# Patient Record
Sex: Female | Born: 1970 | Race: Black or African American | Hispanic: No | Marital: Married | Smoking: Never smoker
Health system: Southern US, Community
[De-identification: ages and names within clinical notes are randomized; demographics above are authoritative.]

## PROBLEM LIST (undated history)

## (undated) DIAGNOSIS — F329 Major depressive disorder, single episode, unspecified: Secondary | ICD-10-CM

## (undated) DIAGNOSIS — F32A Depression, unspecified: Secondary | ICD-10-CM

## (undated) DIAGNOSIS — E785 Hyperlipidemia, unspecified: Secondary | ICD-10-CM

## (undated) DIAGNOSIS — H409 Unspecified glaucoma: Secondary | ICD-10-CM

## (undated) DIAGNOSIS — D649 Anemia, unspecified: Secondary | ICD-10-CM

## (undated) DIAGNOSIS — K219 Gastro-esophageal reflux disease without esophagitis: Secondary | ICD-10-CM

## (undated) DIAGNOSIS — J302 Other seasonal allergic rhinitis: Secondary | ICD-10-CM

## (undated) DIAGNOSIS — G43909 Migraine, unspecified, not intractable, without status migrainosus: Secondary | ICD-10-CM

## (undated) DIAGNOSIS — I10 Essential (primary) hypertension: Secondary | ICD-10-CM

## (undated) HISTORY — DX: Migraine, unspecified, not intractable, without status migrainosus: G43.909

## (undated) HISTORY — DX: Essential (primary) hypertension: I10

## (undated) HISTORY — DX: Hyperlipidemia, unspecified: E78.5

## (undated) HISTORY — DX: Major depressive disorder, single episode, unspecified: F32.9

## (undated) HISTORY — PX: TUBAL LIGATION: SHX77

## (undated) HISTORY — DX: Anemia, unspecified: D64.9

## (undated) HISTORY — DX: Gastro-esophageal reflux disease without esophagitis: K21.9

## (undated) HISTORY — DX: Depression, unspecified: F32.A

## (undated) HISTORY — DX: Unspecified glaucoma: H40.9

---

## 1999-03-29 ENCOUNTER — Encounter: Payer: Self-pay | Admitting: Cardiology

## 1999-03-29 ENCOUNTER — Emergency Department (HOSPITAL_COMMUNITY): Admission: EM | Admit: 1999-03-29 | Discharge: 1999-03-29 | Payer: Self-pay | Admitting: Emergency Medicine

## 1999-03-30 ENCOUNTER — Encounter: Payer: Self-pay | Admitting: Cardiovascular Disease

## 1999-03-30 ENCOUNTER — Emergency Department (HOSPITAL_COMMUNITY): Admission: EM | Admit: 1999-03-30 | Discharge: 1999-03-30 | Payer: Self-pay | Admitting: Emergency Medicine

## 2001-02-12 ENCOUNTER — Emergency Department (HOSPITAL_COMMUNITY): Admission: EM | Admit: 2001-02-12 | Discharge: 2001-02-12 | Payer: Self-pay | Admitting: Emergency Medicine

## 2001-07-05 ENCOUNTER — Encounter: Payer: Self-pay | Admitting: Gastroenterology

## 2001-07-05 ENCOUNTER — Encounter: Admission: RE | Admit: 2001-07-05 | Discharge: 2001-07-05 | Payer: Self-pay | Admitting: Gastroenterology

## 2001-08-31 ENCOUNTER — Emergency Department (HOSPITAL_COMMUNITY): Admission: EM | Admit: 2001-08-31 | Discharge: 2001-08-31 | Payer: Self-pay | Admitting: Emergency Medicine

## 2001-08-31 ENCOUNTER — Encounter: Payer: Self-pay | Admitting: Emergency Medicine

## 2001-09-08 ENCOUNTER — Encounter: Admission: RE | Admit: 2001-09-08 | Discharge: 2001-09-08 | Payer: Self-pay | Admitting: Gastroenterology

## 2001-09-08 ENCOUNTER — Encounter: Payer: Self-pay | Admitting: Gastroenterology

## 2001-09-09 ENCOUNTER — Encounter: Payer: Self-pay | Admitting: Emergency Medicine

## 2001-09-09 ENCOUNTER — Emergency Department (HOSPITAL_COMMUNITY): Admission: EM | Admit: 2001-09-09 | Discharge: 2001-09-09 | Payer: Self-pay | Admitting: Emergency Medicine

## 2003-09-01 ENCOUNTER — Emergency Department (HOSPITAL_COMMUNITY): Admission: AD | Admit: 2003-09-01 | Discharge: 2003-09-01 | Payer: Self-pay | Admitting: Family Medicine

## 2004-03-20 ENCOUNTER — Emergency Department (HOSPITAL_COMMUNITY): Admission: EM | Admit: 2004-03-20 | Discharge: 2004-03-20 | Payer: Self-pay | Admitting: Family Medicine

## 2004-04-07 ENCOUNTER — Encounter: Admission: RE | Admit: 2004-04-07 | Discharge: 2004-04-07 | Payer: Self-pay | Admitting: Cardiology

## 2004-06-12 ENCOUNTER — Emergency Department (HOSPITAL_COMMUNITY): Admission: EM | Admit: 2004-06-12 | Discharge: 2004-06-12 | Payer: Self-pay | Admitting: Family Medicine

## 2004-11-10 ENCOUNTER — Emergency Department (HOSPITAL_COMMUNITY): Admission: EM | Admit: 2004-11-10 | Discharge: 2004-11-10 | Payer: Self-pay | Admitting: Family Medicine

## 2005-04-20 ENCOUNTER — Other Ambulatory Visit: Admission: RE | Admit: 2005-04-20 | Discharge: 2005-04-20 | Payer: Self-pay | Admitting: Obstetrics and Gynecology

## 2005-05-31 ENCOUNTER — Encounter: Admission: RE | Admit: 2005-05-31 | Discharge: 2005-05-31 | Payer: Self-pay | Admitting: Cardiology

## 2005-06-20 ENCOUNTER — Emergency Department (HOSPITAL_COMMUNITY): Admission: EM | Admit: 2005-06-20 | Discharge: 2005-06-20 | Payer: Self-pay | Admitting: Emergency Medicine

## 2006-03-10 ENCOUNTER — Emergency Department (HOSPITAL_COMMUNITY): Admission: EM | Admit: 2006-03-10 | Discharge: 2006-03-10 | Payer: Self-pay | Admitting: Family Medicine

## 2006-04-09 ENCOUNTER — Emergency Department (HOSPITAL_COMMUNITY): Admission: EM | Admit: 2006-04-09 | Discharge: 2006-04-09 | Payer: Self-pay | Admitting: Family Medicine

## 2006-06-18 ENCOUNTER — Emergency Department (HOSPITAL_COMMUNITY): Admission: EM | Admit: 2006-06-18 | Discharge: 2006-06-18 | Payer: Self-pay | Admitting: Family Medicine

## 2006-07-10 ENCOUNTER — Emergency Department (HOSPITAL_COMMUNITY): Admission: EM | Admit: 2006-07-10 | Discharge: 2006-07-10 | Payer: Self-pay | Admitting: Emergency Medicine

## 2006-07-28 ENCOUNTER — Ambulatory Visit: Payer: Self-pay | Admitting: Family Medicine

## 2006-08-09 ENCOUNTER — Ambulatory Visit: Payer: Self-pay | Admitting: Family Medicine

## 2006-09-06 ENCOUNTER — Ambulatory Visit: Payer: Self-pay | Admitting: Family Medicine

## 2006-11-02 ENCOUNTER — Emergency Department (HOSPITAL_COMMUNITY): Admission: EM | Admit: 2006-11-02 | Discharge: 2006-11-02 | Payer: Self-pay | Admitting: Emergency Medicine

## 2007-05-27 ENCOUNTER — Ambulatory Visit: Payer: Self-pay | Admitting: Family Medicine

## 2007-07-21 ENCOUNTER — Ambulatory Visit: Payer: Self-pay | Admitting: Family Medicine

## 2007-07-21 DIAGNOSIS — K219 Gastro-esophageal reflux disease without esophagitis: Secondary | ICD-10-CM

## 2007-07-21 DIAGNOSIS — E785 Hyperlipidemia, unspecified: Secondary | ICD-10-CM | POA: Insufficient documentation

## 2007-07-25 ENCOUNTER — Telehealth: Payer: Self-pay | Admitting: Family Medicine

## 2007-08-24 ENCOUNTER — Emergency Department (HOSPITAL_COMMUNITY): Admission: EM | Admit: 2007-08-24 | Discharge: 2007-08-24 | Payer: Self-pay | Admitting: Emergency Medicine

## 2007-12-20 ENCOUNTER — Ambulatory Visit: Payer: Self-pay | Admitting: Family Medicine

## 2007-12-20 DIAGNOSIS — J309 Allergic rhinitis, unspecified: Secondary | ICD-10-CM

## 2008-02-01 ENCOUNTER — Ambulatory Visit: Payer: Self-pay | Admitting: Family Medicine
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
## 2008-06-18 ENCOUNTER — Emergency Department (HOSPITAL_COMMUNITY): Admission: EM | Admit: 2008-06-18 | Discharge: 2008-06-18 | Payer: Self-pay | Admitting: Family Medicine

## 2008-09-18 ENCOUNTER — Telehealth: Payer: Self-pay | Admitting: Family Medicine

## 2008-10-02 ENCOUNTER — Ambulatory Visit: Payer: Self-pay | Admitting: Family Medicine

## 2009-08-02 ENCOUNTER — Ambulatory Visit: Payer: Self-pay | Admitting: Family Medicine

## 2009-10-23 ENCOUNTER — Telehealth: Payer: Self-pay | Admitting: Family Medicine

## 2009-11-01 ENCOUNTER — Ambulatory Visit: Payer: Self-pay | Admitting: Family Medicine

## 2009-11-05 ENCOUNTER — Telehealth: Payer: Self-pay | Admitting: Family Medicine

## 2009-11-11 ENCOUNTER — Ambulatory Visit: Payer: Self-pay | Admitting: Family Medicine

## 2009-11-11 LAB — CONVERTED CEMR LAB
Bilirubin Urine: NEGATIVE
Blood in Urine, dipstick: NEGATIVE
Glucose, Urine, Semiquant: NEGATIVE
Ketones, urine, test strip: NEGATIVE
Nitrite: NEGATIVE
Specific Gravity, Urine: 1.02
Urobilinogen, UA: 0.2
WBC Urine, dipstick: NEGATIVE
pH: 7

## 2009-11-12 ENCOUNTER — Telehealth: Payer: Self-pay | Admitting: Family Medicine

## 2009-11-12 LAB — CONVERTED CEMR LAB
ALT: 17 units/L (ref 0–35)
AST: 21 units/L (ref 0–37)
Albumin: 3.8 g/dL (ref 3.5–5.2)
Alkaline Phosphatase: 69 units/L (ref 39–117)
BUN: 14 mg/dL (ref 6–23)
Basophils Absolute: 0 10*3/uL (ref 0.0–0.1)
Basophils Relative: 0.4 % (ref 0.0–3.0)
Bilirubin, Direct: 0 mg/dL (ref 0.0–0.3)
CO2: 29 meq/L (ref 19–32)
Calcium: 9.3 mg/dL (ref 8.4–10.5)
Chloride: 106 meq/L (ref 96–112)
Cholesterol: 238 mg/dL — ABNORMAL HIGH (ref 0–200)
Creatinine, Ser: 0.8 mg/dL (ref 0.4–1.2)
Direct LDL: 150.9 mg/dL
Eosinophils Absolute: 0.2 10*3/uL (ref 0.0–0.7)
Eosinophils Relative: 2.8 % (ref 0.0–5.0)
GFR calc non Af Amer: 104.64 mL/min (ref >60)
Glucose, Bld: 93 mg/dL (ref 70–99)
HCT: 31.4 % — ABNORMAL LOW (ref 36.0–46.0)
HDL: 47.1 mg/dL (ref >39.00)
Hemoglobin: 10.7 g/dL — ABNORMAL LOW (ref 12.0–15.0)
Lymphocytes Relative: 44.1 % (ref 12.0–46.0)
Lymphs Abs: 2.4 10*3/uL (ref 0.7–4.0)
MCHC: 33.9 g/dL (ref 30.0–36.0)
MCV: 86.9 fL (ref 78.0–100.0)
Monocytes Absolute: 0.4 10*3/uL (ref 0.1–1.0)
Monocytes Relative: 7.3 % (ref 3.0–12.0)
Neutro Abs: 2.5 10*3/uL (ref 1.4–7.7)
Neutrophils Relative %: 45.4 % (ref 43.0–77.0)
Platelets: 228 10*3/uL (ref 150.0–400.0)
Potassium: 4.9 meq/L (ref 3.5–5.1)
RBC: 3.62 M/uL — ABNORMAL LOW (ref 3.87–5.11)
RDW: 13.3 % (ref 11.5–14.6)
Sodium: 142 meq/L (ref 135–145)
TSH: 1.08 microintl units/mL (ref 0.35–5.50)
Total Bilirubin: 0.3 mg/dL (ref 0.3–1.2)
Total CHOL/HDL Ratio: 5
Total Protein: 7.2 g/dL (ref 6.0–8.3)
Triglycerides: 221 mg/dL — ABNORMAL HIGH (ref 0.0–149.0)
VLDL: 44.2 mg/dL — ABNORMAL HIGH (ref 0.0–40.0)
WBC: 5.5 10*3/uL (ref 4.5–10.5)

## 2009-11-18 ENCOUNTER — Ambulatory Visit: Payer: Self-pay | Admitting: Family Medicine

## 2009-12-07 ENCOUNTER — Emergency Department (HOSPITAL_COMMUNITY): Admission: EM | Admit: 2009-12-07 | Discharge: 2009-12-07 | Payer: Self-pay | Admitting: Emergency Medicine

## 2009-12-09 ENCOUNTER — Telehealth: Payer: Self-pay | Admitting: Family Medicine

## 2009-12-20 ENCOUNTER — Ambulatory Visit: Payer: Self-pay | Admitting: Family Medicine

## 2009-12-31 ENCOUNTER — Ambulatory Visit: Payer: Self-pay | Admitting: Family Medicine

## 2010-01-10 ENCOUNTER — Emergency Department (HOSPITAL_COMMUNITY): Admission: EM | Admit: 2010-01-10 | Discharge: 2010-01-10 | Payer: Self-pay | Admitting: Family Medicine

## 2010-01-16 ENCOUNTER — Telehealth: Payer: Self-pay | Admitting: Family Medicine

## 2010-01-31 ENCOUNTER — Emergency Department (HOSPITAL_COMMUNITY): Admission: EM | Admit: 2010-01-31 | Discharge: 2010-01-31 | Payer: Self-pay | Admitting: Family Medicine

## 2010-05-05 ENCOUNTER — Encounter: Payer: Self-pay | Admitting: Family Medicine

## 2010-06-09 ENCOUNTER — Encounter: Payer: Self-pay | Admitting: Family Medicine

## 2010-07-06 DIAGNOSIS — E079 Disorder of thyroid, unspecified: Secondary | ICD-10-CM

## 2010-07-06 HISTORY — DX: Disorder of thyroid, unspecified: E07.9

## 2010-07-10 ENCOUNTER — Ambulatory Visit
Admission: RE | Admit: 2010-07-10 | Discharge: 2010-07-10 | Payer: Self-pay | Source: Home / Self Care | Attending: Family Medicine | Admitting: Family Medicine

## 2010-07-10 DIAGNOSIS — D649 Anemia, unspecified: Secondary | ICD-10-CM | POA: Insufficient documentation

## 2010-07-11 ENCOUNTER — Other Ambulatory Visit: Payer: Self-pay | Admitting: Family Medicine

## 2010-07-11 ENCOUNTER — Ambulatory Visit
Admission: RE | Admit: 2010-07-11 | Discharge: 2010-07-11 | Payer: Self-pay | Source: Home / Self Care | Attending: Family Medicine | Admitting: Family Medicine

## 2010-07-11 LAB — CBC WITH DIFFERENTIAL/PLATELET
Basophils Absolute: 0 10*3/uL (ref 0.0–0.1)
Basophils Relative: 0.6 % (ref 0.0–3.0)
Eosinophils Absolute: 0.1 10*3/uL (ref 0.0–0.7)
Eosinophils Relative: 2.3 % (ref 0.0–5.0)
HCT: 33.1 % — ABNORMAL LOW (ref 36.0–46.0)
Hemoglobin: 11.2 g/dL — ABNORMAL LOW (ref 12.0–15.0)
Lymphocytes Relative: 43.7 % (ref 12.0–46.0)
Lymphs Abs: 2.2 10*3/uL (ref 0.7–4.0)
MCHC: 33.9 g/dL (ref 30.0–36.0)
MCV: 86.8 fl (ref 78.0–100.0)
Monocytes Absolute: 0.3 10*3/uL (ref 0.1–1.0)
Monocytes Relative: 6.8 % (ref 3.0–12.0)
Neutro Abs: 2.3 10*3/uL (ref 1.4–7.7)
Neutrophils Relative %: 46.6 % (ref 43.0–77.0)
Platelets: 226 10*3/uL (ref 150.0–400.0)
RBC: 3.82 Mil/uL — ABNORMAL LOW (ref 3.87–5.11)
RDW: 12.9 % (ref 11.5–14.6)
WBC: 4.9 10*3/uL (ref 4.5–10.5)

## 2010-07-11 LAB — TSH: TSH: 0.91 u[IU]/mL (ref 0.35–5.50)

## 2010-07-11 LAB — LIPID PANEL
Cholesterol: 217 mg/dL — ABNORMAL HIGH (ref 0–200)
HDL: 44.5 mg/dL (ref >39.00)
Total CHOL/HDL Ratio: 5
Triglycerides: 278 mg/dL — ABNORMAL HIGH (ref 0.0–149.0)
VLDL: 55.6 mg/dL — ABNORMAL HIGH (ref 0.0–40.0)

## 2010-07-11 LAB — HIGH SENSITIVITY CRP: CRP, High Sensitivity: 0.59 mg/L (ref 0.00–5.00)

## 2010-07-11 LAB — SEDIMENTATION RATE: Sed Rate: 38 mm/hr — ABNORMAL HIGH (ref 0–22)

## 2010-07-11 LAB — LDL CHOLESTEROL, DIRECT: Direct LDL: 118.4 mg/dL

## 2010-08-05 NOTE — Progress Notes (Signed)
Summary: no show  Phone Note Other Incoming   Summary of Call: no show  Follow-up for Phone Call        charge the NS fee Follow-up by: Nelwyn Salisbury MD,  January 16, 2010 12:33 PM

## 2010-08-05 NOTE — Progress Notes (Signed)
Summary: Pt is req FMLA statement re: Dr Clent Ridges treating pt for migraines  Phone Note Call from Patient Call back at Home Phone [phone]   Caller: Patient Summary of Call: Pt called and is needing to get a FMLA from where Dr. Clent Ridges took pt out of work for Migraines. Pt req a statement that says that Dr. Clent Ridges has been treating pt for migraines and how often pt has been out of work for them. Pt is needing this done asap.  Initial call taken by: Lucy Antigua,  December 09, 2009 3:53 PM  Follow-up for Phone Call        she needs to supply me with an FLMA form so I can fill it out Follow-up by: Nelwyn Salisbury MD,  December 18, 2009 8:48 AM  Additional Follow-up for Phone Call Additional follow up Details #1::        [facility] Additional Follow-up by: Raechel Ache, RN,  December 18, 2009 9:29 AM

## 2010-08-05 NOTE — Progress Notes (Signed)
Summary: Pt req addtl time added to [facility] or doctors note  Phone Note Call from Patient Call back at [facility] Phone [phone]   Caller: Patient Summary of Call: Pt called and said that Dr. Clent Ridges gave her a FMLA Form, so that pt could be out of work for 3 days and pt has gone over that time by 1 day. Pt is req a doctors note to return to work tonight at 10pm, if not, pt will need to be out addtl 2 days. Please advise.  Initial call taken by: Lucy Antigua,  October 23, 2009 4:05 PM  Follow-up for Phone Call        please give her a note to cover the additional days Follow-up by: Nelwyn Salisbury MD,  October 28, 2009 9:29 AM  Additional Follow-up for Phone Call Additional follow up Details #1::        [facility] Additional Follow-up by: Raechel Ache, RN,  October 28, 2009 10:47 AM

## 2010-08-05 NOTE — Progress Notes (Signed)
Summary: rtc  Phone Note Call from Patient Call back at Home Phone [phone]   Caller: Patient Call For: Nelwyn Salisbury MD Summary of Call: pt is return judy call Initial call taken by: Heron Sabins,  Nov 12, 2009 12:11 PM  Follow-up for Phone Call        Phone Call Completed Follow-up by: Raechel Ache, RN,  Nov 12, 2009 12:38 PM

## 2010-08-05 NOTE — Progress Notes (Signed)
Summary: problems with Cymbalta  Phone Note Call from Patient   Caller: Patient Call For: Nelwyn Salisbury MD Summary of Call: Pt had meds changed from Wellbutrin to Cymbalta, and is feeling very tired and sleepy.  Cannot stay at work. [phone]  Initial call taken by: Lynann Beaver CMA,  Nov 05, 2009 2:44 PM  Follow-up for Phone Call        okay it may be too strong. Stop this and call in Zoloft 50 mg once daily , #30 with no rf. See me in 2 weeks.  Follow-up by: Nelwyn Salisbury MD,  Nov 06, 2009 1:20 PM  Additional Follow-up for Phone Call Additional follow up Details #1::        Phone Call Completed, Rx Called In Additional Follow-up by: Raechel Ache, RN,  Nov 06, 2009 1:44 PM    New/Updated Medications: ZOLOFT 50 MG TABS (SERTRALINE HCL) 1 once daily Prescriptions: ZOLOFT 50 MG TABS (SERTRALINE HCL) 1 once daily  #30 x 0   Entered by:   Raechel Ache, RN   Authorized by:   Nelwyn Salisbury MD   Signed by:   Raechel Ache, RN on 11/06/2009   Method used:   Electronically to        [facility]  [address] [phone]* (retail)       [address]       [place], [place]  [address]       Ph: [phone]       Fax: [phone]   RxID:   [id number]

## 2010-08-05 NOTE — Assessment & Plan Note (Signed)
Summary: migraines//ccm   Vital Signs:  Patient profile:   40 year old female Weight:      149 pounds BP sitting:   164 / 102  (left arm) Cuff size:   regular  Vitals Entered By: Raechel Ache, RN (December 20, 2009 3:08 PM) CC: C/o bad headaches and sick to stomach.   History of Present Illness: Here for worsening HAs over the past 2 weeks. She has been taking Cymbalta for about a month, but she has been feeling poorly since she started it. She feels depressed, tired, moody, and now the HAs are worse as well. She has had a global HA all day every day for 2 weeks. Imitrex does not help. She is nauseated but has not vomited. No blurred vision but she is sensitive to bright lights. No other neurologic deficits. Her daughter drove her here today.   Allergies: 1)  Celexa 2)  Penicillin G Potassium (Penicillin G Potassium)  Past History:  Past Medical History: Reviewed history from 11/18/2009 and no changes required. migraine headaches Depression GERD Hyperlipidemia sees Dr. Vickey Sages for GYN exams  Review of Systems  The patient denies anorexia, fever, weight loss, weight gain, vision loss, decreased hearing, hoarseness, chest pain, syncope, dyspnea on exertion, peripheral edema, prolonged cough, hemoptysis, abdominal pain, melena, hematochezia, severe indigestion/heartburn, hematuria, incontinence, genital sores, muscle weakness, suspicious skin lesions, transient blindness, difficulty walking, unusual weight change, abnormal bleeding, enlarged lymph nodes, angioedema, breast masses, and testicular masses.    Physical Exam  General:  in pain, photophobic, alert Head:  Normocephalic and atraumatic without obvious abnormalities. No apparent alopecia or balding. Eyes:  No corneal or conjunctival inflammation noted. EOMI. Perrla. Funduscopic exam benign, without hemorrhages, exudates or papilledema. Vision grossly normal. Neck:  No deformities, masses, or tenderness noted. Lungs:  Normal  respiratory effort, chest expands symmetrically. Lungs are clear to auscultation, no crackles or wheezes. Heart:  Normal rate and regular rhythm. S1 and S2 normal without gallop, murmur, click, rub or other extra sounds. Neurologic:  No cranial nerve deficits noted. Station and gait are normal. Plantar reflexes are down-going bilaterally. DTRs are symmetrical throughout. Sensory, motor and coordinative functions appear intact.   Impression & Recommendations:  Problem # 1:  MIGRAINE HEADACHE (ICD-346.90)  Her updated medication list for this problem includes:    Imitrex 100 Mg Tabs (Sumatriptan succinate) ..... One as needed ha    Vicodin 5-500 Mg Tabs (Hydrocodone-acetaminophen) .Marland Kitchen... 1 q 6 hours as needed ha  Orders: Headache Clinic Referral (Headache) Demerol  100mg   Injection (B1478) Admin of Therapeutic Inj  intramuscular or subcutaneous (29562) Promethazine up to 50mg  (J2550)  Problem # 2:  DEPRESSION (ICD-311)  The following medications were removed from the medication list:    Cymbalta 30 Mg Cpep (Duloxetine hcl) ..... Once daily  Complete Medication List: 1)  Imitrex 100 Mg Tabs (Sumatriptan succinate) .... One as needed ha 2)  Protonix 40 Mg Tbec (Pantoprazole sodium) .... Once daily 3)  Claritin-d 24 Hour 10-240 Mg Xr24h-tab (Loratadine-pseudoephedrine) .... Once daily 4)  Vicodin 5-500 Mg Tabs (Hydrocodone-acetaminophen) .Marland Kitchen.. 1 q 6 hours as needed ha  Patient Instructions: 1)  Stop Cymbalta. Conitnue Imitrex as needed . Use Vicodin as needed . will refer to Neurology Prescriptions: VICODIN 5-500 MG TABS (HYDROCODONE-ACETAMINOPHEN) 1 q 6 hours as needed HA  #60 x 0   Entered and Authorized by:   Nelwyn Salisbury MD   Signed by:   Nelwyn Salisbury MD on 12/20/2009   Method  used:   Print then Give to Patient   RxID:   [id number]    Medication Administration  Injection # 1:    Medication: Demerol  100mg   Injection    Diagnosis: MIGRAINE HEADACHE (ICD-346.90)     Route: IM    Site: LUOQ gluteus    Exp Date: 09/04/2010    Lot #: [id number]    Mfr: Hospira    Comments: 50mg  given    Patient tolerated injection without complications    Given by: Raechel Ache, RN (December 20, 2009 4:03 PM)  Injection # 2:    Medication: Promethazine up to 50mg     Diagnosis: MIGRAINE HEADACHE (ICD-346.90)    Route: IM    Site: LUOQ gluteus    Exp Date: 11/12    Lot #: [id number]    Mfr: [email or web address]    Comments: 25mg  given  Orders Added: 1)  Est. Patient Level IV [65784] 2)  Headache Clinic Referral [Headache] 3)  Demerol  100mg   Injection [J2175] 4)  Admin of Therapeutic Inj  intramuscular or subcutaneous [96372] 5)  Promethazine up to 50mg  [J2550]

## 2010-08-05 NOTE — Letter (Signed)
Summary: [facility]   Imported By: Maryln Gottron 05/26/2010 11:18:08  _____________________________________________________________________  External Attachment:    Type:   Image     Comment:   External Document

## 2010-08-05 NOTE — Assessment & Plan Note (Signed)
Summary: headaches//ccm   Vital Signs:  Patient profile:   40 year old female Weight:      146 pounds BMI:     24.01 Temp:     98.6 degrees F oral BP sitting:   128 / 78  (left arm) Cuff size:   regular  Vitals Entered By: Raechel Ache, RN (November 01, 2009 11:43 AM) CC: Talk about Wellbutrin.   History of Present Illness: Here for several reasons. First she thinks the Wellbutrin is not doing much for her depression. She still feels sad, unmotivated, tired, and has trouble enjoying things. She sleeps well, and her appetite is fine. Also 3 days ago she scratched her right upper arm on the corner of a mop, and she thinks her last tetanus shot was many years ago.   Allergies: 1)  Celexa 2)  Penicillin G Potassium (Penicillin G Potassium)  Past History:  Past Medical History: Reviewed history from 08/02/2009 and no changes required. migraine headaches Depression GERD Hyperlipidemia  Review of Systems  The patient denies anorexia, fever, weight loss, weight gain, vision loss, decreased hearing, hoarseness, chest pain, syncope, dyspnea on exertion, peripheral edema, prolonged cough, headaches, hemoptysis, abdominal pain, melena, hematochezia, severe indigestion/heartburn, hematuria, incontinence, genital sores, muscle weakness, suspicious skin lesions, transient blindness, difficulty walking, unusual weight change, abnormal bleeding, enlarged lymph nodes, angioedema, breast masses, and testicular masses.    Physical Exam  General:  Well-developed,well-nourished,in no acute distress; alert,appropriate and cooperative throughout examination Skin:  long superficial scratch on the right upper arm  Psych:  Cognition and judgment appear intact. Alert and cooperative with normal attention span and concentration. No apparent delusions, illusions, hallucinations   Impression & Recommendations:  Problem # 1:  DEPRESSION (ICD-311)  The following medications were removed from the  medication list:    Wellbutrin Xl 150 Mg Xr24h-tab (Bupropion hcl) ..... Once daily Her updated medication list for this problem includes:    Cymbalta 30 Mg Cpep (Duloxetine hcl) ..... Once daily  Problem # 2:  LACERATION (ICD-879.8)  Complete Medication List: 1)  Imitrex 100 Mg Tabs (Sumatriptan succinate) .... One as needed ha 2)  Diclofenac Sodium 50 Mg Tbec (Diclofenac sodium) .... Three times a day as needed pain 3)  Protonix 40 Mg Tbec (Pantoprazole sodium) .... Once daily 4)  Claritin-d 24 Hour 10-240 Mg Xr24h-tab (Loratadine-pseudoephedrine) .... Once daily 5)  Cymbalta 30 Mg Cpep (Duloxetine hcl) .... Once daily  Other Orders: Tdap => 54yrs IM (16109) Admin 1st Vaccine (60454)  Patient Instructions: 1)  Switch from Wellbutrin to Cymbalta. Hopefully boosting her norepinephrine levels will help. Recheck in 3 weeks with a cpx.    Immunizations Administered:  Tetanus Vaccine:    Vaccine Type: Tdap    Site: right deltoid    Mfr: GlaxoSmithKline    Dose: 0.5 ml    Route: IM    Given by: Raechel Ache, RN    Exp. Date: 09/28/2011    Lot #: [id number]    VIS given: 05/24/07 version given November 01, 2009.

## 2010-08-05 NOTE — Assessment & Plan Note (Signed)
Summary: WHELPS, ?ALLERGIC RX/HEADACHES CONTINUE/PS/PT RSC/CJR   Vital Signs:  Patient profile:   40 year old female Weight:      154 pounds Temp:     98.3 degrees F oral BP sitting:   130 / 78  (left arm) Cuff size:   regular  Vitals Entered By: Raechel Ache, RN (December 31, 2009 3:22 PM) CC: c/O "WHELPS"OR HIVES ALL OVER & ITCHY X FEW DAYS.   History of Present Illness: Here for itchy rashes all over her body that have come up over the past week. She continues to have daily HAs, but they are not as severe as when she was here last. She is working with [facility] to get an appointment soon. No new meds recenyl, no other new things in her environment. She thinks the rashes are coming from her "nerves".   Allergies: 1)  Celexa 2)  Penicillin G Potassium (Penicillin G Potassium)  Past History:  Past Medical History: Reviewed history from 11/18/2009 and no changes required. migraine headaches Depression GERD Hyperlipidemia sees Dr. Vickey Sages for GYN exams  Review of Systems  The patient denies anorexia, fever, weight loss, weight gain, vision loss, decreased hearing, hoarseness, chest pain, syncope, dyspnea on exertion, peripheral edema, prolonged cough, hemoptysis, abdominal pain, melena, hematochezia, severe indigestion/heartburn, hematuria, incontinence, genital sores, muscle weakness, suspicious skin lesions, transient blindness, difficulty walking, depression, unusual weight change, abnormal bleeding, enlarged lymph nodes, angioedema, breast masses, and testicular masses.    Physical Exam  General:  Well-developed,well-nourished,in no acute distress; alert,appropriate and cooperative throughout examination Eyes:  No corneal or conjunctival inflammation noted. EOMI. Perrla. Funduscopic exam benign, without hemorrhages, exudates or papilledema. Vision grossly normal. Neurologic:  alert & oriented X3, cranial nerves II-XII intact, and gait normal.   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 One as needed ha    Vicodin 5-500 Mg Tabs (Hydrocodone-acetaminophen) .Marland Kitchen... 1 q 6 hours as needed ha  Problem # 3:  ANXIETY STATE, UNSPECIFIED (ICD-300.00)  Her updated medication list for this problem includes:    Alprazolam 1 Mg Tabs (Alprazolam) .Marland Kitchen..Marland Kitchen Two times a day as needed anxiety  Complete Medication List: 1)  Imitrex 100 Mg Tabs (Sumatriptan succinate) .... One as needed ha 2)  Protonix 40 Mg Tbec (Pantoprazole sodium) .... Once daily 3)  Claritin-d 24 Hour 10-240 Mg Xr24h-tab (Loratadine-pseudoephedrine) .... Once daily 4)  Vicodin 5-500 Mg Tabs (Hydrocodone-acetaminophen) .Marland Kitchen.. 1 q 6 hours as needed ha 5)  Alprazolam 1 Mg Tabs (Alprazolam) .... Two times a day as needed anxiety  Patient Instructions: 1)  Try Xanax as needed for anxiety. See the [facility] ASAP. Try Zyrtec 10 mg two times a day for itching.  Prescriptions: ALPRAZOLAM 1 MG TABS (ALPRAZOLAM) two times a day as needed anxiety  #60 x 2   Entered and Authorized by:   Nelwyn Salisbury MD   Signed by:   Nelwyn Salisbury MD on 12/31/2009   Method used:   Print then Give to Patient   RxID:   [id number]

## 2010-08-05 NOTE — Assessment & Plan Note (Signed)
Summary: VISION DISTURBANCE, H/A // RS   Vital Signs:  Patient profile:   40 year old female Weight:      154 pounds BMI:     25.33 Temp:     98.5 degrees F oral Pulse rate:   69 / minute BP sitting:   134 / 84  (left arm) Cuff size:   regular  Vitals Entered By: Alfred Levins, CMA (August 02, 2009 3:50 PM) CC: she sees red dots when she turns her head x2   History of Present Illness: Here for HAs and for flashing lights in her vision for the past 2 days. She has had migraines for years, but does not usually have auras with them. Now for 2 days has had an intermittent dull HA in the left forehead. Has had brief flashing red lights in both fields of vision. Some nausea but no vomitting. Has had a lot of allergy sympotms as well this winter with sinus congestion, sneezing, and itchy eyes. No fever or ST or cough. She needs med refills. Has been out of Wellbutrin for several months. She averages 3 or 4 migraines a month, each lasting 1-3 days.   Current Medications (verified): 1)  Imitrex 100 Mg Tabs (Sumatriptan Succinate) .... One As Needed Ha 2)  Diclofenac Sodium 50 Mg  Tbec (Diclofenac Sodium) .... Three Times A Day As Needed Pain 3)  Wellbutrin Xl 150 Mg Xr24h-Tab (Bupropion Hcl) .... Once Daily  Allergies (verified): 1)  Celexa 2)  Penicillin G Potassium (Penicillin G Potassium)  Past History:  Past Medical History: migraine headaches Depression GERD Hyperlipidemia  Review of Systems  The patient denies anorexia, fever, weight loss, weight gain, vision loss, decreased hearing, hoarseness, chest pain, syncope, dyspnea on exertion, peripheral edema, prolonged cough, hemoptysis, abdominal pain, melena, hematochezia, severe indigestion/heartburn, hematuria, incontinence, genital sores, muscle weakness, suspicious skin lesions, transient blindness, difficulty walking, depression, unusual weight change, abnormal bleeding, enlarged lymph nodes, angioedema, breast masses, and  testicular masses.    Physical Exam  General:  Well-developed,well-nourished,in no acute distress; alert,appropriate and cooperative throughout examination Head:  Normocephalic and atraumatic without obvious abnormalities. No apparent alopecia or balding. Eyes:  No corneal or conjunctival inflammation noted. EOMI. Perrla. Funduscopic exam benign, without hemorrhages, exudates or papilledema. Vision grossly normal. Ears:  External ear exam shows no significant lesions or deformities.  Otoscopic examination reveals clear canals, tympanic membranes are intact bilaterally without bulging, retraction, inflammation or discharge. Hearing is grossly normal bilaterally. Nose:  External nasal examination shows no deformity or inflammation. Nasal mucosa are pink and moist without lesions or exudates. Mouth:  Oral mucosa and oropharynx without lesions or exudates.  Teeth in good repair. Neck:  No deformities, masses, or tenderness noted. Neurologic:  No cranial nerve deficits noted. Station and gait are normal. Plantar reflexes are down-going bilaterally. DTRs are symmetrical throughout. Sensory, motor and coordinative functions appear intact.   Impression & Recommendations:  Problem # 1:  MIGRAINE HEADACHE (ICD-346.90)  Her updated medication list for this problem includes:    Imitrex 100 Mg Tabs (Sumatriptan succinate) ..... One as needed ha    Diclofenac Sodium 50 Mg Tbec (Diclofenac sodium) .Marland Kitchen... Three times a day as needed pain  Problem # 2:  ALLERGIC RHINITIS (ICD-477.9)  Problem # 3:  GERD (ICD-530.81)  Her updated medication list for this problem includes:    Protonix 40 Mg Tbec (Pantoprazole sodium) ..... Once daily  Problem # 4:  DEPRESSION (ICD-311)  Her updated medication list for this problem includes:  Wellbutrin Xl 150 Mg Xr24h-tab (Bupropion hcl) ..... Once daily  Complete Medication List: 1)  Imitrex 100 Mg Tabs (Sumatriptan succinate) .... One as needed ha 2)  Diclofenac  Sodium 50 Mg Tbec (Diclofenac sodium) .... Three times a day as needed pain 3)  Wellbutrin Xl 150 Mg Xr24h-tab (Bupropion hcl) .... Once daily 4)  Protonix 40 Mg Tbec (Pantoprazole sodium) .... Once daily 5)  Claritin-d 24 Hour 10-240 Mg Xr24h-tab (Loratadine-pseudoephedrine) .... Once daily  Patient Instructions: 1)  These visual changes represent optical manifestations  of her migraines. refilled Imitrex to use as needed . Her allergies are not controlled, andI think these contribute to her migraines. Get on Claritin D or Zyrtec D daily. Try Protonix for the GERD. I filled out FMLA papers for intermittent absences from work due to the migraines.  2)  Please schedule a follow-up appointment as needed .  Prescriptions: PROTONIX 40 MG TBEC (PANTOPRAZOLE SODIUM) once daily  #90 x 3   Entered and Authorized by:   Nelwyn Salisbury MD   Signed by:   Nelwyn Salisbury MD on 08/02/2009   Method used:   Print then Give to Patient   RxID:   [id number] WELLBUTRIN XL 150 MG XR24H-TAB (BUPROPION HCL) once daily  #90 x 3   Entered and Authorized by:   Nelwyn Salisbury MD   Signed by:   Nelwyn Salisbury MD on 08/02/2009   Method used:   Print then Give to Patient   RxID:   [id number] IMITREX 100 MG TABS (SUMATRIPTAN SUCCINATE) one as needed HA  #36 x 3   Entered and Authorized by:   Nelwyn Salisbury MD   Signed by:   Nelwyn Salisbury MD on 08/02/2009   Method used:   Print then Give to Patient   RxID:   [id number]

## 2010-08-05 NOTE — Assessment & Plan Note (Signed)
Summary: CPX//ALP   Vital Signs:  Patient profile:   40 year old female Weight:      146 pounds BMI:     24.01 BP sitting:   110 / 80  (left arm) Cuff size:   regular  Vitals Entered By: Raechel Ache, RN (Nov 18, 2009 8:50 AM) CC: CPX, labs done, sees gyn.   History of Present Illness: 40 yr old female for a cpx. She had tried Cymbalta after our last visit as she was stopping Wellbutrin. She took 7 days of this and felt it was helping her depression, but she stoped it due to sleepiness. She has already tried Zoloft and did not want to try this again. Now she wonders if she should give Cymbalta one more try now that the Wellbutrin is out of her system.   Allergies: 1)  Celexa 2)  Penicillin G Potassium (Penicillin G Potassium)  Past History:  Past Medical History: migraine headaches Depression GERD Hyperlipidemia sees Dr. Vickey Sages for GYN exams  Past Surgical History: Reviewed history from 05/27/2007 and no changes required. Tubal ligation  Family History: Reviewed history from 05/27/2007 and no changes required. Family History Diabetes 1st degree relative Family History High cholesterol Family History of Stroke M 1st degree relative <50  Social History: Reviewed history from 05/27/2007 and no changes required. Divorced Never Smoked Alcohol use-yes Drug use-no  Review of Systems  The patient denies anorexia, fever, weight loss, weight gain, vision loss, decreased hearing, hoarseness, chest pain, syncope, dyspnea on exertion, peripheral edema, prolonged cough, headaches, hemoptysis, abdominal pain, melena, hematochezia, severe indigestion/heartburn, hematuria, incontinence, genital sores, muscle weakness, suspicious skin lesions, transient blindness, difficulty walking, unusual weight change, abnormal bleeding, enlarged lymph nodes, angioedema, breast masses, and testicular masses.    Physical Exam  General:  Well-developed,well-nourished,in no acute distress;  alert,appropriate and cooperative throughout examination Head:  Normocephalic and atraumatic without obvious abnormalities. No apparent alopecia or balding. Eyes:  No corneal or conjunctival inflammation noted. EOMI. Perrla. Funduscopic exam benign, without hemorrhages, exudates or papilledema. Vision grossly normal. Ears:  External ear exam shows no significant lesions or deformities.  Otoscopic examination reveals clear canals, tympanic membranes are intact bilaterally without bulging, retraction, inflammation or discharge. Hearing is grossly normal bilaterally. Nose:  External nasal examination shows no deformity or inflammation. Nasal mucosa are pink and moist without lesions or exudates. Mouth:  Oral mucosa and oropharynx without lesions or exudates.  Teeth in good repair. Neck:  No deformities, masses, or tenderness noted. Chest Wall:  No deformities, masses, or tenderness noted. Lungs:  Normal respiratory effort, chest expands symmetrically. Lungs are clear to auscultation, no crackles or wheezes. Heart:  Normal rate and regular rhythm. S1 and S2 normal without gallop, murmur, click, rub or other extra sounds. Abdomen:  Bowel sounds positive,abdomen soft and non-tender without masses, organomegaly or hernias noted. Msk:  No deformity or scoliosis noted of thoracic or lumbar spine.   Pulses:  R and L carotid,radial,femoral,dorsalis pedis and posterior tibial pulses are full and equal bilaterally Extremities:  No clubbing, cyanosis, edema, or deformity noted with normal full range of motion of all joints.   Neurologic:  No cranial nerve deficits noted. Station and gait are normal. Plantar reflexes are down-going bilaterally. DTRs are symmetrical throughout. Sensory, motor and coordinative functions appear intact. Skin:  Intact without suspicious lesions or rashes Cervical Nodes:  No lymphadenopathy noted Axillary Nodes:  No palpable lymphadenopathy Inguinal Nodes:  No significant  adenopathy Psych:  Cognition and judgment appear intact.  Alert and cooperative with normal attention span and concentration. No apparent delusions, illusions, hallucinations   Impression & Recommendations:  Problem # 1:  HEALTH MAINTENANCE EXAM (ICD-V70.0)  Complete Medication List: 1)  Imitrex 100 Mg Tabs (Sumatriptan succinate) .... One as needed ha 2)  Protonix 40 Mg Tbec (Pantoprazole sodium) .... Once daily 3)  Claritin-d 24 Hour 10-240 Mg Xr24h-tab (Loratadine-pseudoephedrine) .... Once daily 4)  Cymbalta 30 Mg Cpep (Duloxetine hcl) .... Once daily  Patient Instructions: 1)  Try Cymbalta one more time. Call me in 3 weeks

## 2010-08-07 NOTE — Assessment & Plan Note (Signed)
Summary: COUGH AND CONGESTION//SLM   Vital Signs:  Patient profile:   40 year old female Weight:      159 pounds Temp:     97.9 degrees F oral BP sitting:   130 / 98  (left arm) Cuff size:   regular  Vitals Entered By: Sid Falcon LPN (July 10, 2010 5:05 PM)  History of Present Illness: Pt seen as a work in for the following:  Over one month hx of bilateral max facial pain and some nasal congestion. Using Afrin rarely.  NO signif PND and no purulent secretions.  Only rare cough and no sore throat.  Intermittent bifrontal and occipital headaches which seem to correlate with her facial pain.  No fever.  No alleviating factors.  No hx of seasonal  allergies.  Hx glaucoma and had laser eye surgery for this Dec 5 and she felt symtpms started around then.  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   Pt does have hx of hyperlipidemia.  Also hx of anemia and is taking iron.  Some fatigue issues recently.  No orthostatic symptoms.  Good appetite and eating well. Pt concerned about thyroid with some recnet weight gain and FH of hypothroid in mother.  Allergies: 1)  Celexa 2)  Penicillin G Potassium (Penicillin G Potassium)  Past History:  Past Medical History: Last updated: 11/18/2009 migraine headaches Depression GERD Hyperlipidemia sees Dr. Vickey Sages for GYN exams  Past Surgical History: Last updated: 05/27/2007 Tubal ligation  Family History: Last updated: 05/27/2007 Family History Diabetes 1st degree relative Family History High cholesterol Family History of Stroke M 1st degree relative <50  Social History: Last updated: 05/27/2007 Divorced Never Smoked Alcohol use-yes Drug use-no  Risk Factors: Smoking Status: never (05/27/2007) PMH-FH-SH reviewed for relevance  Review of Systems       The patient complains of headaches and weight gain.  The patient denies anorexia, fever, hoarseness,  chest pain, dyspnea on exertion, peripheral edema, prolonged cough, hemoptysis, abdominal pain, melena, hematochezia, and severe indigestion/heartburn.    Physical Exam  General:  Well-developed,well-nourished,in no acute distress; alert,appropriate and cooperative throughout examination Head:  Normocephalic and atraumatic without obvious abnormalities. No apparent alopecia or balding. Eyes:  pupils equal, pupils round, and pupils reactive to light.   Mouth:  Oral mucosa and oropharynx without lesions or exudates.  Teeth in good repair. Neck:  No deformities, masses, or tenderness noted. Lungs:  Normal respiratory effort, chest expands symmetrically. Lungs are clear to auscultation, no crackles or wheezes. Heart:  Normal rate and regular rhythm. S1 and S2 normal without gallop, murmur, click, rub or other extra sounds. Neurologic:  alert & oriented X3, cranial nerves II-XII intact, and strength normal in all extremities.     Impression & Recommendations:  Problem # 1:  SINUSITIS, ACUTE (ICD-461.9) follow up with Dr Clent Ridges if no better in 2 weeks. Her updated medication list for this problem includes:    Claritin-d 24 Hour 10-240 Mg Xr24h-tab (Loratadine-pseudoephedrine) ..... Once daily    Azithromycin 250 Mg Tabs (Azithromycin) .Marland Kitchen... 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  Labs will be orderd as per ophthalmology.  Problem # 3:  ANEMIA (ICD-285.9) repeat CBC  Problem # 4:  FATIGUE (ICD-780.79) ?sec to #3.  Pt concerned about thyroid but normal lasst year.  Complete Medication List: 1)  Imitrex  100 Mg Tabs (Sumatriptan succinate) .... One as needed ha 2)  Protonix 40 Mg Tbec (Pantoprazole sodium) .... Once daily 3)  Claritin-d 24 Hour 10-240 Mg Xr24h-tab (Loratadine-pseudoephedrine) .... Once daily 4)  Vicodin 5-500 Mg Tabs  (Hydrocodone-acetaminophen) .Marland Kitchen.. 1 q 6 hours as needed ha 5)  Alprazolam 1 Mg Tabs (Alprazolam) .... Two times a day as needed anxiety 6)  Azithromycin 250 Mg Tabs (Azithromycin) .... 2 by mouth today then one by mouth once daily for 4 days  Patient Instructions: 1)  Schedule pt for CBC, CRP, ESR, and TSH  V12.40, 780.79 2)  lipid  27 Prescriptions: AZITHROMYCIN 250 MG TABS (AZITHROMYCIN) 2 by mouth today then one by mouth once daily for 4 days  #6 x 0   Entered and Authorized by:   Evelena Peat MD   Signed by:   Evelena Peat MD on 07/10/2010   Method used:   Electronically to        [facility]  [address] [phone]* (retail)       [address]       [place], [place]  [address]       Ph: [phone]       Fax: [phone]   RxID:   [id number]    Orders Added: 1)  Est. Patient Level IV [24401]

## 2010-08-07 NOTE — Letter (Signed)
Summary: [facility]   Imported By: Maryln Gottron 06/20/2010 12:53:48  _____________________________________________________________________  External Attachment:    Type:   Image     Comment:   External Document

## 2010-09-17 ENCOUNTER — Encounter: Payer: Self-pay | Admitting: Family Medicine

## 2010-09-23 ENCOUNTER — Ambulatory Visit (HOSPITAL_COMMUNITY)
Admission: RE | Admit: 2010-09-23 | Discharge: 2010-09-23 | Disposition: A | Discharge status: Home / Self Care | Payer: 59 | Source: Ambulatory Visit | Attending: Internal Medicine | Admitting: Internal Medicine

## 2010-09-23 ENCOUNTER — Other Ambulatory Visit: Payer: Self-pay | Admitting: Internal Medicine

## 2010-09-23 DIAGNOSIS — G43909 Migraine, unspecified, not intractable, without status migrainosus: Secondary | ICD-10-CM

## 2010-09-23 DIAGNOSIS — E7800 Pure hypercholesterolemia, unspecified: Secondary | ICD-10-CM | POA: Insufficient documentation

## 2010-09-23 DIAGNOSIS — H409 Unspecified glaucoma: Secondary | ICD-10-CM | POA: Insufficient documentation

## 2010-09-26 NOTE — Progress Notes (Signed)
This was a No Show. It should have been taken care of

## 2010-09-29 NOTE — Progress Notes (Signed)
This encounter was created in error - please disregard.

## 2010-11-21 NOTE — Assessment & Plan Note (Signed)
[facility] OFFICE NOTE   Brenda Barrett, Brenda Barrett                       MRN:          [id number]  DATE:07/28/2006                            DOB:          Aug 31, 1970    This is a 40 year old woman here to establish with our practice who also  has several specific problems to discuss. She had previously received  her primary care from Dr. Shana Chute and is now transferring to [place]. She  does see Dr. Renaldo Fiddler on a regular basis for gynecology care. She recently  had some screening laboratories done through Dr. Renaldo Fiddler' office  including a thyroid panel which was normal, a fasting glucose which was  normal, and a lipid panel which was abnormal. We did receive faxed  copies of this from Dr. Renaldo Fiddler' office. LDL was up to 162, HDL was  adequate at 42. The patient wants to know what needs to be done about  this. Also, for the past month, she has had some intermittent pains in  both of her wrists, especially the left wrist at the base of the thumb.  She does a lot of repetitive movements on her job. She works for the  [employer], and she does work the Engineer, maintenance (IT). She has felt that heat is helpful and  ibuprofen is helpful. There has been no particular swelling. She also  wants to discuss her history of depression today. She had been on Celexa  and Zoloft a few years ago. She was very pleased with how the Celexa  worked at that point. She has been off of the medications for several  years and thinks she ought to go back. She describes generalized  feelings of sadness, lack of motivation, etc. She does sleep fairly well  and has a good appetite.   Other past medical history, she has migraine headaches and has had them  since she was 40 years old. They are fairly infrequent as far as severe  headaches, but she does get a mild to moderate headache about three  times a month. She uses  Imitrex and gets good relief from them. She is a  G4, P4, having had 4 vaginal deliveries. She then had a bilateral tubal  ligation.   ALLERGIES:  PENICILLIN.   CURRENT MEDICATIONS:  1. Imitrex 100 mg tablets as needed.  2. Xanax 1 mg 1-1/2 tablet t.i.d. as needed for anxiety.  3. Celebrex 200 mg once a day as needed for joint pains.   HABITS:  She does not use tobacco. She drinks some alcohol.   SOCIAL HISTORY:  She is divorced with 4 children.   FAMILY HISTORY:  Remarkable for high cholesterol, strokes and diabetes.   OBJECTIVE:  Height 5 foot 7 inches, weight 171. BP 124/82, pulse 80 and  regular.  In general, her affect is mildly depressed, but she has good eye contact  and is appropriate.  Examination of her left wrist shows a lot of tenderness at the base of  the thumb but full range of motion and no  edema.   ASSESSMENT AND PLAN:  1. Depression. In addition to her Xanax above, we will begin Celexa 40      mg once daily. Asked her to follow up with me in one month.  2. Hyperlipidemia. We talked about changing her diet and will check      another fasting lipid panel in 6 months.  3. Tendinitis. She can continue to use Celebrex on an as-needed basis.      I did suggest using topical ice packs rather than heat, however.  4. Migraine headaches, stable.     Tera Mater. Clent Ridges, MD  Electronically Signed    SAF/MedQ  DD: 07/29/2006  DT: 07/29/2006  Job #: [id number]

## 2011-05-21 ENCOUNTER — Other Ambulatory Visit: Payer: Self-pay | Admitting: Internal Medicine

## 2011-05-21 DIAGNOSIS — R9389 Abnormal findings on diagnostic imaging of other specified body structures: Secondary | ICD-10-CM

## 2011-05-25 ENCOUNTER — Other Ambulatory Visit: Payer: Self-pay | Admitting: Internal Medicine

## 2011-05-25 ENCOUNTER — Ambulatory Visit (HOSPITAL_COMMUNITY)
Admission: RE | Admit: 2011-05-25 | Discharge: 2011-05-25 | Disposition: A | Discharge status: Home / Self Care | Payer: 59 | Source: Ambulatory Visit | Attending: Internal Medicine | Admitting: Internal Medicine

## 2011-05-25 DIAGNOSIS — E042 Nontoxic multinodular goiter: Secondary | ICD-10-CM | POA: Insufficient documentation

## 2011-05-25 DIAGNOSIS — R9389 Abnormal findings on diagnostic imaging of other specified body structures: Secondary | ICD-10-CM

## 2011-05-25 DIAGNOSIS — E041 Nontoxic single thyroid nodule: Secondary | ICD-10-CM

## 2011-05-27 ENCOUNTER — Other Ambulatory Visit (HOSPITAL_COMMUNITY)
Admission: RE | Admit: 2011-05-27 | Discharge: 2011-05-27 | Disposition: A | Discharge status: Home / Self Care | Payer: 59 | Source: Ambulatory Visit | Attending: Interventional Radiology | Admitting: Interventional Radiology

## 2011-05-27 ENCOUNTER — Ambulatory Visit
Admission: RE | Admit: 2011-05-27 | Discharge: 2011-05-27 | Disposition: A | Discharge status: Home / Self Care | Payer: 59 | Source: Ambulatory Visit | Attending: Internal Medicine | Admitting: Internal Medicine

## 2011-05-27 DIAGNOSIS — E041 Nontoxic single thyroid nodule: Secondary | ICD-10-CM

## 2011-05-27 DIAGNOSIS — E042 Nontoxic multinodular goiter: Secondary | ICD-10-CM

## 2011-05-27 DIAGNOSIS — E049 Nontoxic goiter, unspecified: Secondary | ICD-10-CM | POA: Insufficient documentation

## 2011-06-17 ENCOUNTER — Telehealth: Payer: Self-pay | Admitting: Family Medicine

## 2011-06-17 NOTE — Telephone Encounter (Signed)
Triage Record Num: [id number] Operator: Remonia Richter Patient Name: Paiden Caraveo Call Date & Time: 06/16/2011 9:42:08PM Patient Phone: [phone] PCP: Tera Mater. Clent Ridges Patient Gender: Female PCP Fax : [phone] Patient DOB: 01-10-1971 Practice Name: [facility] Reason for Call: Caller: Pattye/Patient; PCP: Nelwyn Salisbury.; CB#: [phone]; Call regarding Vomiting, diarrhea, chills; symptoms started this afternoon, feels warm, no thermometer, stomach hurting with cramps,LMP 05/26/11, all emergent s/s ruled out,home care advise given per Vomting Guideline, callback perimeters gone over Protocol(s) Used: Nausea or Vomiting Recommended Outcome per Protocol: Provide Home/Self Care Reason for Outcome: New onset of 3-4 episodes vomiting or diarrhea following mild abdominal cramping Care Advice: Antidiarrheal medications are usually unnecessary. Use only after consulting your provider. Application of A&D ointment or witch hazel medicated pads may help relieve anal irritation. ~ ~ Call provider if symptoms do not improve after 24 hours of home care. Call provider immediately if develop severe pain, black, tarry stools, bloody stools, blood-streaked or coffee ground-looking vomitus, or abdomen swollen. ~ ~ SYMPTOM / CONDITION MANAGEMENT ~ CAUTIONS Go to the ED if you have developed signs and symptoms of dehydration such as very dry mouth and tongue; increased pulse rate at rest; no urine output for 8 hours or more; increasing weakness or drowsiness, or lightheadedness when trying to sit upright or standing. ~ Vomiting and Diarrhea Care: - Do not eat solid foods until vomiting subsides. - Begin taking fluids by sucking on ice chips or popsicles or taking sips of cool, clear fluids (soda, fruit juices that are low acid, sports drinks or nonprescription oral rehydration solution). - Gradually drink larger amounts of these fluids so that you are drinking six to eight 8 oz. (1.2  to 1.6 liters) of fluids a day. - Keep activity to a minimum. - Once vomiting and diarrhea subside, eat smaller, more frequent meals of easily digested foods such as crackers, toast, bananas, rice, cooked cereal, applesauce, broth, baked or mashed potatoes, chicken or [place] without skin. Eat slowly. - Take fluids 30 minutes before or 60 minutes after meals. - Avoid high fat, highly seasoned, high fiber or high sugar content foods. - Avoid extremely hot or cold foods. - Do not take pain medication (such as aspirin, NSAIDs) while nauseated or vomiting. - Do not drink caffeinated or alcoholic beverages. ~

## 2011-08-17 ENCOUNTER — Emergency Department (HOSPITAL_COMMUNITY)
Admission: EM | Admit: 2011-08-17 | Discharge: 2011-08-17 | Disposition: A | Discharge status: Home / Self Care | Payer: 59 | Source: Home / Self Care | Attending: Family Medicine | Admitting: Family Medicine

## 2011-08-17 NOTE — ED Notes (Signed)
Pt. Stated, I'm congested in my chest and in my nasal area since yesterday.

## 2011-08-19 ENCOUNTER — Ambulatory Visit: Payer: 59 | Admitting: Family Medicine

## 2011-08-19 DIAGNOSIS — Z0289 Encounter for other administrative examinations: Secondary | ICD-10-CM

## 2011-08-21 NOTE — ED Provider Notes (Signed)
History     CSN: [id number]  Arrival date & time 08/17/11  1707   First MD Initiated Contact with Patient 08/17/11 1844      Chief Complaint  Patient presents with  . URI    (Consider location/radiation/quality/duration/timing/severity/associated sxs/prior treatment) HPI  Past Medical History  Diagnosis Date  . Depression   . GERD (gastroesophageal reflux disease)   . Hyperlipidemia   . Migraines   . Seasonal allergies     Past Surgical History  Procedure Date  . Tubal ligation     Family History  Problem Relation Age of Onset  . Diabetes      family hx  . Hyperlipidemia      family hx  . Stroke      family hx    History  Substance Use Topics  . Smoking status: Never Smoker   . Smokeless tobacco: Not on file  . Alcohol Use: No    OB History    Grav Para Term Preterm Abortions TAB SAB Ect Mult Living                  Review of Systems  Allergies  Citalopram hydrobromide and Penicillins  Home Medications   Current Outpatient Rx  Name Route Sig Dispense Refill  . ALPRAZOLAM 1 MG PO TABS Oral Take 1 mg by mouth 2 (two) times daily as needed.      . BUPROPION HCL 100 MG PO TABS Oral Take 100 mg by mouth 2 (two) times daily.    Marland Kitchen FLUTICASONE PROPIONATE 50 MCG/ACT NA SUSP Nasal Place 2 sprays into the nose daily. 16 g 0  . HYDROCODONE-ACETAMINOPHEN 5-500 MG PO TABS Oral Take 1 tablet by mouth every 6 (six) hours as needed.      Marland Kitchen PANTOPRAZOLE SODIUM 40 MG PO TBEC Oral Take 40 mg by mouth daily.      Marland Kitchen PSEUDOEPHEDRINE-GUAIFENESIN ER [phone] MG PO TB12 Oral Take 1 tablet by mouth 2 (two) times daily as needed (congestion). 20 each 0  . SUMATRIPTAN SUCCINATE 100 MG PO TABS Oral Take 100 mg by mouth as needed.        There were no vitals taken for this visit.  Physical Exam  ED Course  Procedures (including critical care time)  Labs Reviewed - No data to display No results found.   No diagnosis found.    MDM          Barkley Bruns, MD 09/04/11 [phone]

## 2011-08-24 ENCOUNTER — Encounter (HOSPITAL_COMMUNITY): Payer: Self-pay | Admitting: *Deleted

## 2011-08-24 ENCOUNTER — Emergency Department (INDEPENDENT_AMBULATORY_CARE_PROVIDER_SITE_OTHER)
Admission: EM | Admit: 2011-08-24 | Discharge: 2011-08-24 | Disposition: A | Discharge status: Home / Self Care | Payer: 59 | Source: Home / Self Care | Attending: Emergency Medicine | Admitting: Emergency Medicine

## 2011-08-24 DIAGNOSIS — J069 Acute upper respiratory infection, unspecified: Secondary | ICD-10-CM

## 2011-08-24 HISTORY — DX: Other seasonal allergic rhinitis: J30.2

## 2011-08-24 MED ORDER — PSEUDOEPHEDRINE-GUAIFENESIN ER 120-1200 MG PO TB12: 1.0000 | ORAL_TABLET | Freq: Two times a day (BID) | ORAL | Status: DC | PRN

## 2011-08-24 MED ORDER — FLUTICASONE PROPIONATE 50 MCG/ACT NA SUSP: 2.0000 | Freq: Every day | NASAL | Status: DC

## 2011-08-24 NOTE — ED Notes (Signed)
Pt is here with complaints of non productive cough and sore throat x 8 days.  Denies fever.

## 2011-08-24 NOTE — Discharge Instructions (Signed)
Take the medication as written. Return if you get worse, have a fever >100.4, or for any concerns. You may take 600 mg of motrin with 1 gram of tylenol up to 4 times a day as needed for pain. This is an effective combination for pain.  Most sinus infections are viral and do not need antibiotics unless you have a high fever, have had this for 10 day, or you get better and then get sick again. Use a neti pot or the NeilMed sinus rinse as often as you want to to reduce nasal congestion. Follow the directions on the box.   

## 2011-08-24 NOTE — ED Provider Notes (Addendum)
History     CSN: [id number]  Arrival date & time 08/24/11  1147   First MD Initiated Contact with Patient 08/24/11 1251      Chief Complaint  Patient presents with  . Cough  . Sore Throat    (Consider location/radiation/quality/duration/timing/severity/associated sxs/prior treatment) HPI Comments: Patient with nasal congestion, rhinorrhea, postnasal drip, sore throat, nonproductive cough for 8 days. No sinus pain, pressure, purulent nasal discharge. No nausea, vomiting, ear pain, change in hearing. No chest pain, wheezing, shortness of breath, abdominal pain. Patient has been taking over-the-counter cold medications with temporary relief. States that several members of her family members have similar symptoms.  ROS as noted in HPI. All other ROS negative.   Patient is a 41 y.o. female presenting with cough and pharyngitis. The history is provided by the patient. No language interpreter was used.  Cough This is a new problem. The current episode started more than 1 week ago. The problem occurs constantly. The problem has not changed since onset.The cough is non-productive. There has been no fever. Associated symptoms include rhinorrhea and sore throat. Pertinent negatives include no chest pain, no ear pain, no headaches, no myalgias, no shortness of breath and no wheezing. She has tried decongestants for the symptoms. The treatment provided mild relief. She is not a smoker.  Sore Throat Pertinent negatives include no chest pain, no headaches and no shortness of breath.    Past Medical History  Diagnosis Date  . Depression   . GERD (gastroesophageal reflux disease)   . Hyperlipidemia   . Migraines   . Seasonal allergies     Past Surgical History  Procedure Date  . Tubal ligation     Family History  Problem Relation Age of Onset  . Diabetes      family hx  . Hyperlipidemia      family hx  . Stroke      family hx    History  Substance Use Topics  . Smoking status:  Never Smoker   . Smokeless tobacco: Not on file  . Alcohol Use: No    OB History    Grav Para Term Preterm Abortions TAB SAB Ect Mult Living                  Review of Systems  HENT: Positive for sore throat and rhinorrhea. Negative for ear pain.   Respiratory: Positive for cough. Negative for shortness of breath and wheezing.   Cardiovascular: Negative for chest pain.  Musculoskeletal: Negative for myalgias.  Neurological: Negative for headaches.    Allergies  Citalopram hydrobromide and Penicillins  Home Medications   Current Outpatient Rx  Name Route Sig Dispense Refill  . BUPROPION HCL 100 MG PO TABS Oral Take 100 mg by mouth 2 (two) times daily.    Marland Kitchen ALPRAZOLAM 1 MG PO TABS Oral Take 1 mg by mouth 2 (two) times daily as needed.      Marland Kitchen FLUTICASONE PROPIONATE 50 MCG/ACT NA SUSP Nasal Place 2 sprays into the nose daily. 16 g 0  . HYDROCODONE-ACETAMINOPHEN 5-500 MG PO TABS Oral Take 1 tablet by mouth every 6 (six) hours as needed.      Marland Kitchen PANTOPRAZOLE SODIUM 40 MG PO TBEC Oral Take 40 mg by mouth daily.      Marland Kitchen PSEUDOEPHEDRINE-GUAIFENESIN ER [phone] MG PO TB12 Oral Take 1 tablet by mouth 2 (two) times daily as needed (congestion). 20 each 0  . SUMATRIPTAN SUCCINATE 100 MG PO TABS Oral Take 100 mg  by mouth as needed.        BP 137/82  Pulse 72  Temp(Src) 98.4 F (36.9 C) (Oral)  Resp 14  SpO2 100%  LMP 08/22/2011  Physical Exam  Nursing note and vitals reviewed. Constitutional: She is oriented to person, place, and time. She appears well-developed and well-nourished. No distress.  HENT:  Head: Normocephalic and atraumatic.  Right Ear: Tympanic membrane normal.  Nose: Mucosal edema and rhinorrhea present. Right sinus exhibits no maxillary sinus tenderness and no frontal sinus tenderness. Left sinus exhibits no maxillary sinus tenderness and no frontal sinus tenderness.  Mouth/Throat: Uvula is midline and mucous membranes are normal. Posterior oropharyngeal erythema  present. No oropharyngeal exudate.  Eyes: Conjunctivae and EOM are normal.  Neck: Normal range of motion.  Cardiovascular: Normal rate, regular rhythm and normal heart sounds.   Pulmonary/Chest: Effort normal and breath sounds normal.  Abdominal: She exhibits no distension.  Musculoskeletal: Normal range of motion.  Lymphadenopathy:    She has no cervical adenopathy.  Neurological: She is alert and oriented to person, place, and time.  Skin: Skin is warm and dry.  Psychiatric: She has a normal mood and affect. Her behavior is normal. Judgment and thought content normal.    ED Course  Procedures (including critical care time)  Labs Reviewed - No data to display No results found.   1. Upper respiratory infection       MDM    Luiz Blare, MD 08/24/11 1754  Luiz Blare, MD 08/24/11 1754

## 2011-11-06 ENCOUNTER — Ambulatory Visit (INDEPENDENT_AMBULATORY_CARE_PROVIDER_SITE_OTHER): Payer: 59 | Admitting: Family Medicine

## 2011-11-06 ENCOUNTER — Encounter: Payer: Self-pay | Admitting: Family Medicine

## 2011-11-06 ENCOUNTER — Ambulatory Visit: Payer: 59 | Admitting: Family Medicine

## 2011-11-06 VITALS — BP 140/96 | HR 112 | Temp 98.6°F | Wt 166.0 lb

## 2011-11-06 DIAGNOSIS — F329 Major depressive disorder, single episode, unspecified: Secondary | ICD-10-CM

## 2011-11-06 MED ORDER — VENLAFAXINE HCL ER 75 MG PO CP24: 75.0000 mg | ORAL_CAPSULE | Freq: Every day | ORAL | Status: DC

## 2011-11-06 MED ORDER — ALPRAZOLAM 1 MG PO TABS: 1.0000 mg | ORAL_TABLET | Freq: Three times a day (TID) | ORAL | Status: DC | PRN

## 2011-11-06 MED ORDER — BUPROPION HCL ER (XL) 300 MG PO TB24: 300.0000 mg | ORAL_TABLET | Freq: Every day | ORAL | Status: DC

## 2011-11-06 NOTE — Progress Notes (Signed)
  Subjective:    Patient ID: Brenda Barrett, female    DOB: 1970/10/07, 41 y.o.   MRN: [id number]  HPI Here to discuss depression. I have not seen her for over a year now. She has been taking wellbutrin for 3 years, and she was pleased with the results for a long time. However over the past few months she has felt worse again with sadness, tearfulness, hopelessness, and lack of motivation to do anything. She has missed a lot of work, but her employer has been patient with her so far. She got involved with the EAP program, and she met with a therapist yesterday for the first time. His name is Venancio Poisson at [phone], and he plans to see her once a week for awhile. She has trouble sleeping. She denies any periods when she feels very good at all, and she feels tired all the time. She denies any suicidal thoughts.    Review of Systems  Constitutional: Positive for fatigue.  Psychiatric/Behavioral: Positive for dysphoric mood and decreased concentration. Negative for hallucinations, behavioral problems, confusion and agitation. The patient is nervous/anxious. The patient is not hyperactive.        Objective:   Physical Exam  Constitutional: She appears well-developed and well-nourished.  Psychiatric: Her behavior is normal. Judgment and thought content normal.       Her affect is depressed, eye contact is good           Assessment & Plan:  We will increase the Wellbutrin XL to 300 mg a day, and add Effexor XR daily. Use Xanax prn. Recheck here in 3 weeks. Written out of work from 10-21-11 through 10-30-11, and out from 11-04-11 until 11-08-12.

## 2011-11-27 ENCOUNTER — Ambulatory Visit: Payer: 59 | Admitting: Family Medicine

## 2011-12-04 ENCOUNTER — Ambulatory Visit (INDEPENDENT_AMBULATORY_CARE_PROVIDER_SITE_OTHER): Payer: 59 | Admitting: Family Medicine

## 2011-12-04 ENCOUNTER — Encounter: Payer: Self-pay | Admitting: Family Medicine

## 2011-12-04 VITALS — BP 130/80 | HR 104 | Temp 98.9°F | Wt 164.0 lb

## 2011-12-04 DIAGNOSIS — F329 Major depressive disorder, single episode, unspecified: Secondary | ICD-10-CM

## 2011-12-04 DIAGNOSIS — E039 Hypothyroidism, unspecified: Secondary | ICD-10-CM

## 2011-12-04 LAB — BASIC METABOLIC PANEL
BUN: 7 mg/dL (ref 6–23)
CO2: 24 mEq/L (ref 19–32)
Chloride: 106 mEq/L (ref 96–112)
Creatinine, Ser: 0.8 mg/dL (ref 0.4–1.2)
GFR: 108.41 mL/min (ref >60.00)
Glucose, Bld: 79 mg/dL (ref 70–99)
Potassium: 3.7 mEq/L (ref 3.5–5.1)
Sodium: 137 mEq/L (ref 135–145)

## 2011-12-04 LAB — CBC WITH DIFFERENTIAL/PLATELET
Basophils Relative: 0.4 % (ref 0.0–3.0)
Eosinophils Absolute: 0.1 10*3/uL (ref 0.0–0.7)
Eosinophils Relative: 2.4 % (ref 0.0–5.0)
HCT: 34.4 % — ABNORMAL LOW (ref 36.0–46.0)
Hemoglobin: 11.3 g/dL — ABNORMAL LOW (ref 12.0–15.0)
Lymphocytes Relative: 36.1 % (ref 12.0–46.0)
Lymphs Abs: 1.9 10*3/uL (ref 0.7–4.0)
MCHC: 32.8 g/dL (ref 30.0–36.0)
MCV: 86.2 fl (ref 78.0–100.0)
Monocytes Absolute: 0.4 10*3/uL (ref 0.1–1.0)
Monocytes Relative: 8 % (ref 3.0–12.0)
Neutro Abs: 2.9 10*3/uL (ref 1.4–7.7)
Neutrophils Relative %: 53.1 % (ref 43.0–77.0)
Platelets: 257 10*3/uL (ref 150.0–400.0)
RBC: 3.99 Mil/uL (ref 3.87–5.11)
RDW: 12.4 % (ref 11.5–14.6)
WBC: 5.4 10*3/uL (ref 4.5–10.5)

## 2011-12-04 LAB — TSH: TSH: 0.07 u[IU]/mL — ABNORMAL LOW (ref 0.35–5.50)

## 2011-12-04 LAB — HEPATIC FUNCTION PANEL
AST: 20 U/L (ref 0–37)
Alkaline Phosphatase: 87 U/L (ref 39–117)
Total Bilirubin: 0.3 mg/dL (ref 0.3–1.2)

## 2011-12-04 LAB — T4, FREE: Free T4: 0.91 ng/dL (ref 0.60–1.60)

## 2011-12-04 LAB — T3, FREE: T3, Free: 3.3 pg/mL (ref 2.3–4.2)

## 2011-12-04 MED ORDER — LEVOTHYROXINE SODIUM 112 MCG PO TABS: 112.0000 ug | ORAL_TABLET | Freq: Every day | ORAL | Status: DC

## 2011-12-04 NOTE — Progress Notes (Signed)
  Subjective:    Patient ID: Brenda Barrett, female    DOB: 1971/01/07, 41 y.o.   MRN: [id number]  HPI Here to follow up on depression. We saw her 3 weeks ago and we planned for her to take a combination of Wellbutrin and Effexor, however she stopped taking the Effexor after only 3 doses. She found this made her drowsy at work, but she had been taking both meds in the evenings right before she went to work (she works a night shift). She met with the EAP therapist only twice, and then he stopped contacting her. She was not happy with this and she plans to not go back. She feels a little better than before, and she has not missed any more work. She is still sad most of the time. Also, she had been seeing Dr. Margaretmary Bayley for a goiter, and she had a biopsy of this last November. This returned as benign. She was found to be hypothyroid, and she was started on Synthroid one month ago. Her levels have not been checked in several months. She would like for [place] to manage her thyroid and to not return to Dr. Chestine Spore.    Review of Systems  Constitutional: Negative.   Psychiatric/Behavioral: Positive for dysphoric mood. Negative for suicidal ideas, hallucinations, behavioral problems, confusion, sleep disturbance, self-injury, decreased concentration and agitation. The patient is not hyperactive.        Objective:   Physical Exam  Constitutional: She appears well-developed and well-nourished.  Psychiatric: She has a normal mood and affect. Her behavior is normal. Thought content normal.       Her affect is brighter than at her last visit           Assessment & Plan:  As for the depression, I strongly encouraged her to take Effexor in conjunction with Wellbutrin, and she agreed. I advised her to take the Wellbutrin before her work shift, but to take the Effexor after her shift when she gets back home, so as to minimize any sedation effects. I suggested she see Judithe Modest in our office for therapy, and I  gave her info on how to make an appt. Check labs today including a full thyroid panel. See me back in 3 weeks

## 2011-12-08 ENCOUNTER — Encounter: Payer: Self-pay | Admitting: Family Medicine

## 2011-12-08 MED ORDER — LEVOTHYROXINE SODIUM 75 MCG PO TABS: 75.0000 ug | ORAL_TABLET | Freq: Every day | ORAL | Status: DC

## 2011-12-08 NOTE — Progress Notes (Signed)
Addended by: Aniceto Boss A on: 12/08/2011 05:18 PM   Modules accepted: Orders

## 2011-12-08 NOTE — Progress Notes (Signed)
Quick Note:  I spoke with pt, sent script e-scribe and put a copy of results in mail. ______ 

## 2011-12-24 ENCOUNTER — Ambulatory Visit (INDEPENDENT_AMBULATORY_CARE_PROVIDER_SITE_OTHER): Payer: 59 | Admitting: Family Medicine

## 2011-12-24 ENCOUNTER — Encounter: Payer: Self-pay | Admitting: Family Medicine

## 2011-12-24 DIAGNOSIS — F411 Generalized anxiety disorder: Secondary | ICD-10-CM

## 2011-12-24 NOTE — Progress Notes (Signed)
  Subjective:    Patient ID: Brenda Barrett, female    DOB: [birth year], 41 y.o.   MRN: [id number]  HPI This was a No Show   Review of Systems     Objective:   Physical Exam        Assessment & Plan:

## 2012-01-14 ENCOUNTER — Encounter: Payer: Self-pay | Admitting: Family Medicine

## 2012-01-14 ENCOUNTER — Ambulatory Visit (INDEPENDENT_AMBULATORY_CARE_PROVIDER_SITE_OTHER): Payer: 59 | Admitting: Family Medicine

## 2012-01-14 VITALS — BP 130/86 | HR 85 | Temp 98.4°F | Wt 162.0 lb

## 2012-01-14 DIAGNOSIS — F3289 Other specified depressive episodes: Secondary | ICD-10-CM

## 2012-01-14 DIAGNOSIS — Z23 Encounter for immunization: Secondary | ICD-10-CM

## 2012-01-14 DIAGNOSIS — F329 Major depressive disorder, single episode, unspecified: Secondary | ICD-10-CM

## 2012-01-14 DIAGNOSIS — Z0000 Encounter for general adult medical examination without abnormal findings: Secondary | ICD-10-CM

## 2012-01-14 DIAGNOSIS — F411 Generalized anxiety disorder: Secondary | ICD-10-CM

## 2012-01-14 NOTE — Progress Notes (Signed)
  Subjective:    Patient ID: Brenda Barrett, female    DOB: 09/20/1970, 41 y.o.   MRN: [id number]  HPI Here to follow up depression. She feels much better now, and she is taking both Effexor and Wellbutrin. By taking the Effexor after her work shift she has eliminated the drowsiness she was getting like before. Her moods are stable now. She asks for a TDaP shot today since she is around young grandchildren.    Review of Systems  Constitutional: Negative.   Psychiatric/Behavioral: Negative.        Objective:   Physical Exam  Constitutional: She appears well-developed and well-nourished.  Psychiatric: She has a normal mood and affect. Her behavior is normal. Judgment and thought content normal.          Assessment & Plan:  Her depression and anxiety seem to be well controlled now. We will recheck in 90 days

## 2012-01-14 NOTE — Addendum Note (Signed)
Addended by: Aniceto Boss A on: 01/14/2012 12:09 PM   Modules accepted: Orders

## 2012-02-24 ENCOUNTER — Ambulatory Visit: Payer: 59

## 2012-03-01 ENCOUNTER — Ambulatory Visit (INDEPENDENT_AMBULATORY_CARE_PROVIDER_SITE_OTHER): Payer: PRIVATE HEALTH INSURANCE | Admitting: Psychology

## 2012-03-01 DIAGNOSIS — F331 Major depressive disorder, recurrent, moderate: Secondary | ICD-10-CM

## 2012-03-01 DIAGNOSIS — F411 Generalized anxiety disorder: Secondary | ICD-10-CM

## 2012-03-08 ENCOUNTER — Ambulatory Visit: Payer: PRIVATE HEALTH INSURANCE | Admitting: Psychology

## 2012-03-15 ENCOUNTER — Ambulatory Visit: Payer: Self-pay | Admitting: Family Medicine

## 2012-03-15 ENCOUNTER — Encounter: Payer: Self-pay | Admitting: Family Medicine

## 2012-03-15 ENCOUNTER — Ambulatory Visit (INDEPENDENT_AMBULATORY_CARE_PROVIDER_SITE_OTHER): Payer: 59 | Admitting: Family Medicine

## 2012-03-15 VITALS — BP 138/86 | HR 81 | Temp 98.2°F | Wt 165.0 lb

## 2012-03-15 DIAGNOSIS — F329 Major depressive disorder, single episode, unspecified: Secondary | ICD-10-CM

## 2012-03-15 DIAGNOSIS — E039 Hypothyroidism, unspecified: Secondary | ICD-10-CM

## 2012-03-15 DIAGNOSIS — M6740 Ganglion, unspecified site: Secondary | ICD-10-CM

## 2012-03-15 LAB — TSH: TSH: 0.03 u[IU]/mL — ABNORMAL LOW (ref 0.35–5.50)

## 2012-03-15 NOTE — Progress Notes (Signed)
  Subjective:    Patient ID: Brenda Barrett, female    DOB: 09-Mar-1971, 41 y.o.   MRN: [id number]  HPI Here to follow up depression and other issues. She has been struggling with depression and finds it difficult to get out of the house to work every day. She is sad, hopeless, and has little energy. No suicidal thoughts. She met with Caralyn Guile, and he suggested she see a psychiatrist. Also we adjusted her Synthroid dose 90 days ago and this needs to be checked again. Finally for one month she has had a painful lump on her right thumb which makes it very hard to work. No recent trauma.    Review of Systems  Constitutional: Negative.   Musculoskeletal: Positive for arthralgias.  Psychiatric/Behavioral: Positive for dysphoric mood and decreased concentration. Negative for suicidal ideas, hallucinations, confusion and agitation. The patient is not nervous/anxious.        Objective:   Physical Exam  Constitutional: She is oriented to person, place, and time. She appears well-developed and well-nourished.  Musculoskeletal:       There is a tiny tender mobile firm round lump on the flexor surface of the right thumb over the MCP joint. Full ROM   Neurological: She is alert and oriented to person, place, and time.  Psychiatric: Her behavior is normal. Thought content normal.       Affect is depressed but eye contact is good          Assessment & Plan:  I agree that she should see a psychiatrist for her depression, and I gave her some contact information about psychiatrists in town. I encouraged her to call today to get an appt ASAP. We will check a TSH today. She has a ganglion cyst on the thumb, so we will refer her to Hand Surgery. Written out of work today until 03-21-12.

## 2012-03-21 MED ORDER — LEVOTHYROXINE SODIUM 50 MCG PO TABS: 50.0000 ug | ORAL_TABLET | Freq: Every day | ORAL | Status: DC

## 2012-03-21 NOTE — Progress Notes (Signed)
Quick Note:  I spoke with pt and sent script e-scribe. ______ 

## 2012-03-21 NOTE — Addendum Note (Signed)
Addended by: Aniceto Boss A on: 03/21/2012 09:33 AM   Modules accepted: Orders

## 2012-05-16 ENCOUNTER — Encounter: Payer: Self-pay | Admitting: Family Medicine

## 2012-05-16 ENCOUNTER — Ambulatory Visit: Payer: 59 | Admitting: Family Medicine

## 2012-05-16 DIAGNOSIS — Z0289 Encounter for other administrative examinations: Secondary | ICD-10-CM

## 2012-05-30 ENCOUNTER — Telehealth: Payer: Self-pay | Admitting: Family Medicine

## 2012-05-30 ENCOUNTER — Other Ambulatory Visit (INDEPENDENT_AMBULATORY_CARE_PROVIDER_SITE_OTHER): Payer: 59

## 2012-05-30 DIAGNOSIS — E039 Hypothyroidism, unspecified: Secondary | ICD-10-CM

## 2012-05-30 NOTE — Telephone Encounter (Signed)
Please call pt and schedule lab only appointment.

## 2012-05-30 NOTE — Telephone Encounter (Signed)
She can just get the labs done (TSH only).  No need to see me

## 2012-05-30 NOTE — Telephone Encounter (Signed)
Pt has appt scheduled tomorrow to follow up on thyroid medication.  Pt requesting if she can get order to have labs only instead of having to see pcp.  Please advise if ok to schedule lab only visit or if pt should be seen.

## 2012-05-31 ENCOUNTER — Ambulatory Visit: Payer: Self-pay | Admitting: Family Medicine

## 2012-06-01 MED ORDER — LEVOTHYROXINE SODIUM 25 MCG PO TABS: 25.0000 ug | ORAL_TABLET | Freq: Every day | ORAL | Status: DC

## 2012-06-01 NOTE — Progress Notes (Signed)
Quick Note:  I sent script e-scribe and spoke with pt. ______ 

## 2012-06-19 ENCOUNTER — Other Ambulatory Visit: Payer: Self-pay | Admitting: Family Medicine

## 2012-06-21 NOTE — Telephone Encounter (Signed)
Okay for one year of both

## 2012-08-10 ENCOUNTER — Telehealth: Payer: Self-pay | Admitting: Family Medicine

## 2012-08-10 DIAGNOSIS — E039 Hypothyroidism, unspecified: Secondary | ICD-10-CM

## 2012-08-10 NOTE — Telephone Encounter (Signed)
I put in the order for a TSH. She just needs to set up the draw time

## 2012-08-10 NOTE — Telephone Encounter (Signed)
Pt said she is supposed to come in for labs to check her thyroid every 3 months. Pl advise.

## 2012-08-11 ENCOUNTER — Other Ambulatory Visit (INDEPENDENT_AMBULATORY_CARE_PROVIDER_SITE_OTHER): Payer: 59

## 2012-08-11 DIAGNOSIS — E039 Hypothyroidism, unspecified: Secondary | ICD-10-CM

## 2012-08-11 LAB — TSH: TSH: 0.36 u[IU]/mL (ref 0.35–5.50)

## 2012-08-11 NOTE — Telephone Encounter (Signed)
Appt set/kjh

## 2012-08-12 NOTE — Progress Notes (Signed)
Quick Note:  Pt informed, pt will have CPE in 6 months ______

## 2012-08-31 ENCOUNTER — Other Ambulatory Visit: Payer: Self-pay | Admitting: Family Medicine

## 2012-09-01 NOTE — Telephone Encounter (Signed)
Call in #[phone] with 2 rf 

## 2012-09-05 ENCOUNTER — Ambulatory Visit: Payer: Self-pay | Admitting: Family Medicine

## 2012-09-06 ENCOUNTER — Telehealth: Payer: Self-pay | Admitting: Family Medicine

## 2012-09-06 NOTE — Telephone Encounter (Signed)
Spoke with patient and an appointment made 

## 2012-09-06 NOTE — Telephone Encounter (Signed)
Patient Information:  Caller Name: Anusha  Phone: [phone]  Patient: Brenda Barrett  Gender: Female  DOB: [birth year]  Age: 42 Years  PCP: Gershon Crane [facility])  Pregnant: No  Office Follow Up:  Does the office need to follow up with this patient?: Yes  Instructions For The Office: No appts available today.   Symptoms  Reason For Call & Symptoms: Patient calling, having intermittent right shoulder to elbow pain for several days.  Has also had a h/a.  Reviewed Health History In EMR: Yes  Reviewed Medications In EMR: Yes  Reviewed Allergies In EMR: Yes  Reviewed Surgeries / Procedures: Yes  Date of Onset of Symptoms: 08/31/2012  Treatments Tried: Motrin  Treatments Tried Worked: Yes OB / GYN:  LMP: 08/29/2012  Guideline(s) Used:  Arm Pain  Disposition Per Guideline:   See Today or Tomorrow in Office  Reason For Disposition Reached:   Patient wants to be seen  Advice Given:  N/A

## 2012-09-07 ENCOUNTER — Ambulatory Visit (INDEPENDENT_AMBULATORY_CARE_PROVIDER_SITE_OTHER): Payer: 59 | Admitting: Family Medicine

## 2012-09-07 ENCOUNTER — Encounter: Payer: Self-pay | Admitting: Family Medicine

## 2012-09-07 VITALS — BP 170/110 | HR 76 | Temp 99.0°F | Wt 170.0 lb

## 2012-09-07 DIAGNOSIS — R030 Elevated blood-pressure reading, without diagnosis of hypertension: Secondary | ICD-10-CM

## 2012-09-07 MED ORDER — AZITHROMYCIN 250 MG PO TABS: ORAL_TABLET | ORAL | Status: DC

## 2012-09-07 NOTE — Progress Notes (Signed)
  Subjective:    Patient ID: Brenda Barrett, female    DOB: [birth year], 42 y.o.   MRN: [id number]  HPI Here for one week of sinus pressure, HA, chest tightness, and upset stomach. No vomiting or diarrhea. Using Mucinex D and Zyrtec D.    Review of Systems  Constitutional: Negative.   HENT: Positive for congestion, postnasal drip and sinus pressure.   Eyes: Negative.   Respiratory: Positive for cough.   Cardiovascular: Negative.        Objective:   Physical Exam  Constitutional: She appears well-developed and well-nourished.  HENT:  Right Ear: External ear normal.  Left Ear: External ear normal.  Nose: Nose normal.  Mouth/Throat: Oropharynx is clear and moist.  Eyes: Conjunctivae are normal.  Neck: Neck supple.  Cardiovascular: Normal rate, regular rhythm, normal heart sounds and intact distal pulses.   Pulmonary/Chest: Effort normal and breath sounds normal.  Lymphadenopathy:    She has no cervical adenopathy.          Assessment & Plan:  She has a sinusitis which we will treat with a Zpack. Her BP is probably up from the Sudafed she is taking. Stop all the decongestants and drink water. Recheck the BP in the next few weeks

## 2012-09-14 ENCOUNTER — Other Ambulatory Visit: Payer: Self-pay | Admitting: Family Medicine

## 2012-09-21 ENCOUNTER — Telehealth: Payer: Self-pay | Admitting: Family Medicine

## 2012-09-21 NOTE — Telephone Encounter (Signed)
Refill request for Bupropion hcl 150 mg take 1 po qd

## 2012-09-21 NOTE — Telephone Encounter (Signed)
Refill for one year 

## 2012-09-23 NOTE — Telephone Encounter (Signed)
This was a error from pharmacy, pt is on 300 mg dose and she has refills.

## 2012-10-13 ENCOUNTER — Encounter (HOSPITAL_COMMUNITY): Payer: Self-pay | Admitting: Emergency Medicine

## 2012-10-13 ENCOUNTER — Emergency Department (INDEPENDENT_AMBULATORY_CARE_PROVIDER_SITE_OTHER)
Admission: EM | Admit: 2012-10-13 | Discharge: 2012-10-13 | Disposition: A | Discharge status: Home / Self Care | Payer: Self-pay | Source: Home / Self Care | Attending: Emergency Medicine | Admitting: Emergency Medicine

## 2012-10-13 DIAGNOSIS — S161XXA Strain of muscle, fascia and tendon at neck level, initial encounter: Secondary | ICD-10-CM

## 2012-10-13 DIAGNOSIS — S39012A Strain of muscle, fascia and tendon of lower back, initial encounter: Secondary | ICD-10-CM

## 2012-10-13 DIAGNOSIS — S46919A Strain of unspecified muscle, fascia and tendon at shoulder and upper arm level, unspecified arm, initial encounter: Secondary | ICD-10-CM

## 2012-10-13 DIAGNOSIS — S139XXA Sprain of joints and ligaments of unspecified parts of neck, initial encounter: Secondary | ICD-10-CM

## 2012-10-13 MED ORDER — TRAMADOL HCL 50 MG PO TABS: 100.0000 mg | ORAL_TABLET | Freq: Three times a day (TID) | ORAL | Status: DC | PRN

## 2012-10-13 MED ORDER — METHOCARBAMOL 500 MG PO TABS: 500.0000 mg | ORAL_TABLET | Freq: Three times a day (TID) | ORAL | Status: DC

## 2012-10-13 MED ORDER — MELOXICAM 15 MG PO TABS: 15.0000 mg | ORAL_TABLET | Freq: Every day | ORAL | Status: DC
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
## 2012-10-18 ENCOUNTER — Ambulatory Visit (INDEPENDENT_AMBULATORY_CARE_PROVIDER_SITE_OTHER): Payer: 59 | Admitting: Family Medicine

## 2012-10-18 ENCOUNTER — Encounter: Payer: Self-pay | Admitting: Family Medicine

## 2012-10-18 VITALS — BP 136/90 | HR 73 | Temp 97.9°F | Wt 164.0 lb

## 2012-10-18 DIAGNOSIS — J309 Allergic rhinitis, unspecified: Secondary | ICD-10-CM

## 2012-10-18 DIAGNOSIS — R0789 Other chest pain: Secondary | ICD-10-CM

## 2012-10-18 DIAGNOSIS — F329 Major depressive disorder, single episode, unspecified: Secondary | ICD-10-CM

## 2012-10-18 DIAGNOSIS — M791 Myalgia, unspecified site: Secondary | ICD-10-CM

## 2012-10-18 DIAGNOSIS — IMO0001 Reserved for inherently not codable concepts without codable children: Secondary | ICD-10-CM

## 2012-10-18 DIAGNOSIS — F411 Generalized anxiety disorder: Secondary | ICD-10-CM
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
## 2012-11-02 ENCOUNTER — Ambulatory Visit: Payer: Self-pay | Admitting: Licensed Clinical Social Worker

## 2012-11-22 ENCOUNTER — Telehealth: Payer: Self-pay | Admitting: Family Medicine

## 2012-11-22 NOTE — Telephone Encounter (Signed)
Pt did not get the chest films ordered by Dr Selena Batten on 10/18/12. Please advise - contact pt or cancel order as appropriate.

## 2012-11-23 NOTE — Telephone Encounter (Signed)
I did cancel the order. 

## 2012-11-23 NOTE — Telephone Encounter (Signed)
Please cancel the CXR order

## 2012-12-21 IMAGING — US US SOFT TISSUE HEAD/NECK
1 series · 14 of 25 positions shown · non-contrast
Comparison: None.

CLINICAL DATA: Abnormal appearance of thyroid on cervical MRI.

THYROID ULTRASOUND
TECHNIQUE: Ultrasound examination of the thyroid gland and adjacent
soft tissues was performed.

[Series 1: us soft tissue head/neck · 0.13mm/px · 14 of 59 slices shown]
[im 1/59]
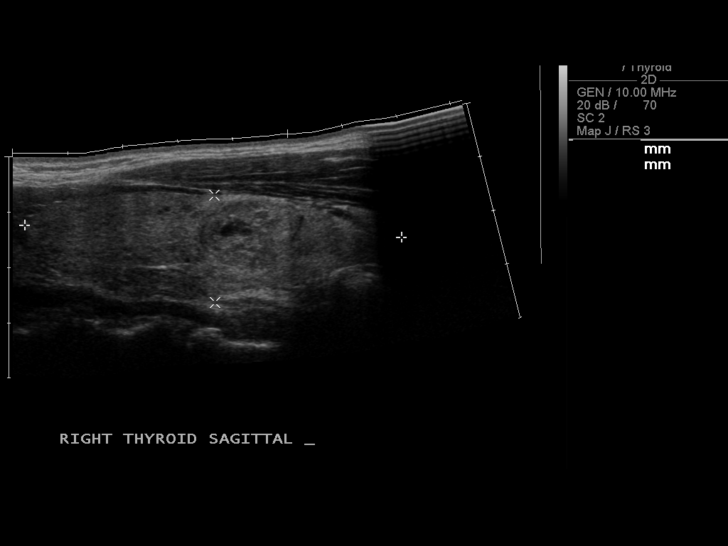
[im 5/59]
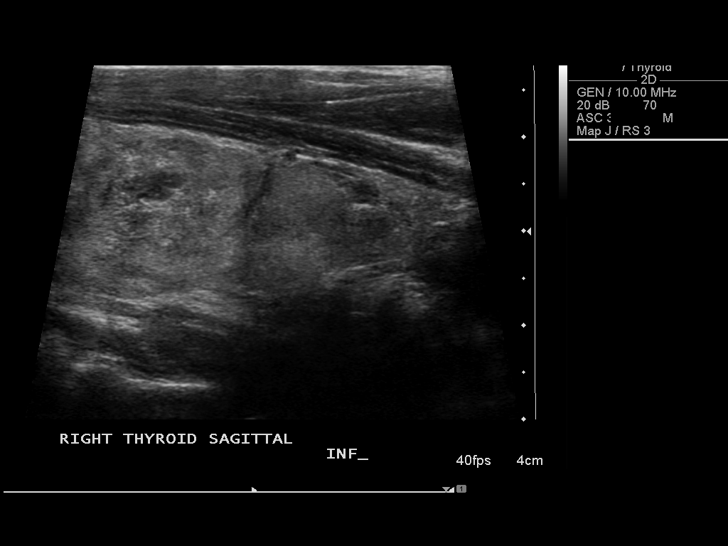
[im 10/59]
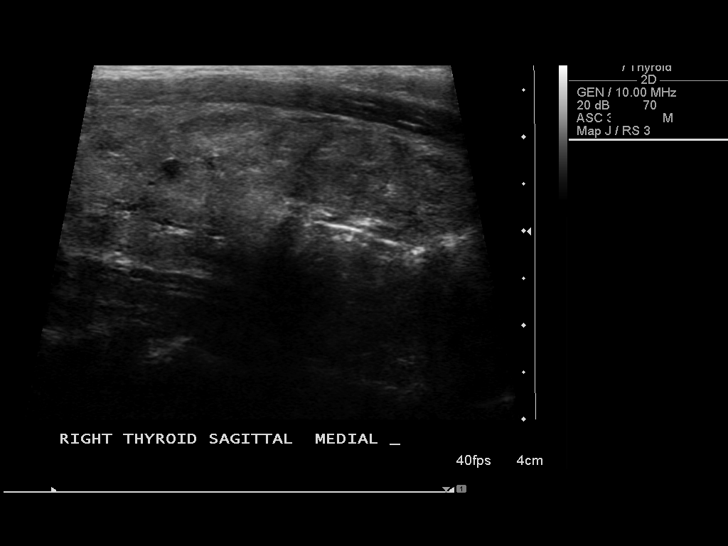
[im 15/59]
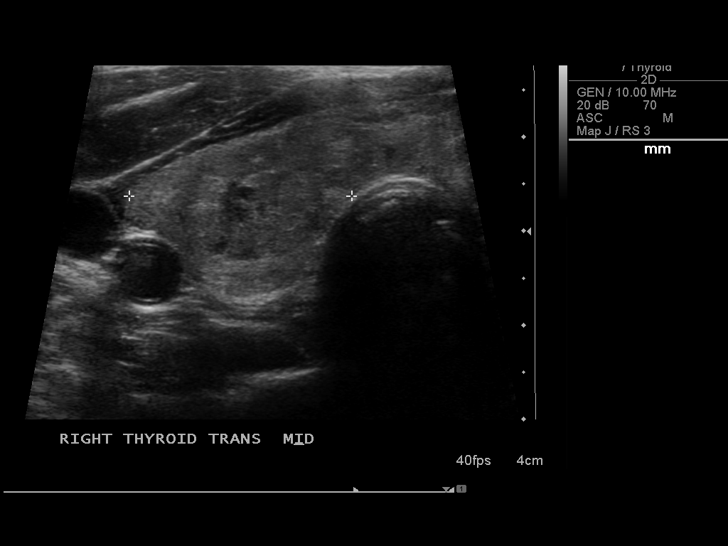
[im 20/59]
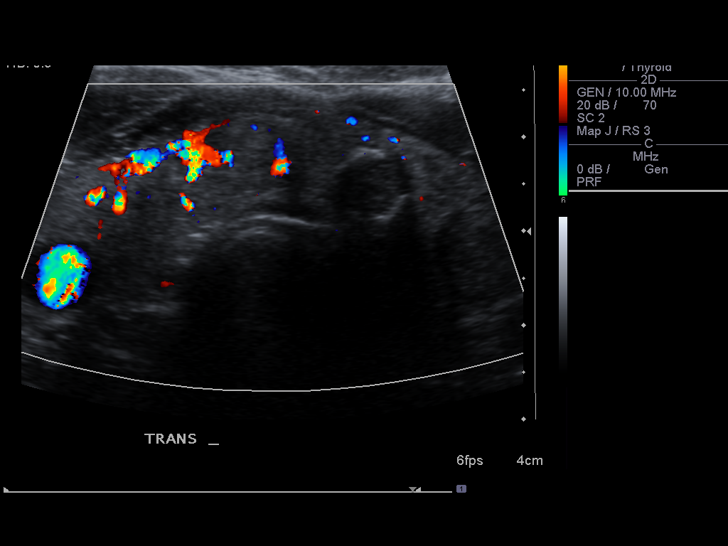
[im 22/59]
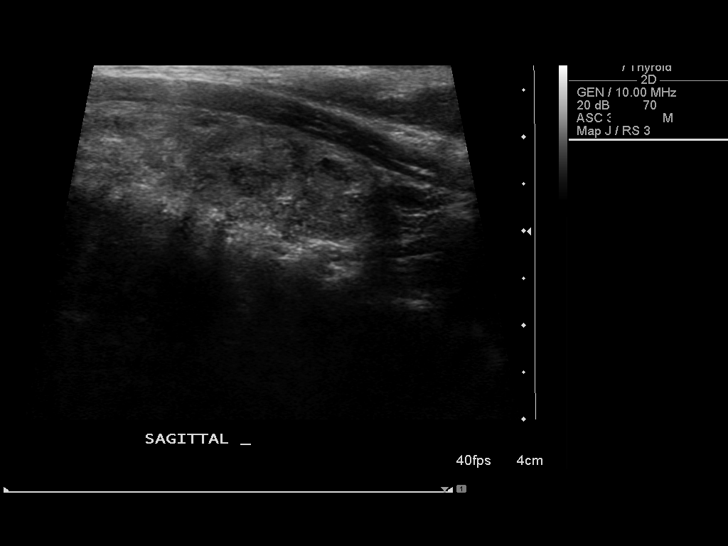
[im 27/59]
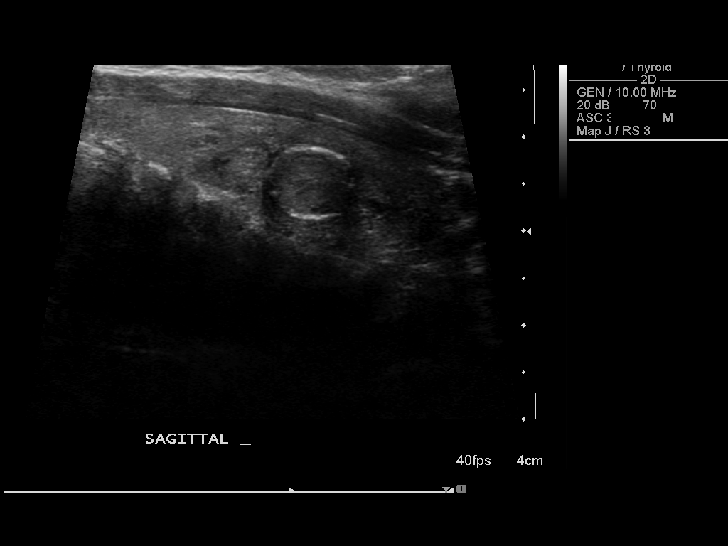
[im 32/59]
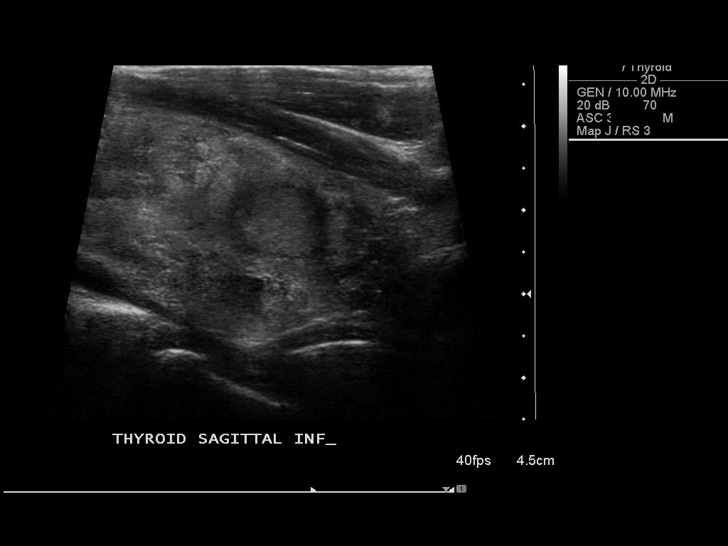
[im 37/59]
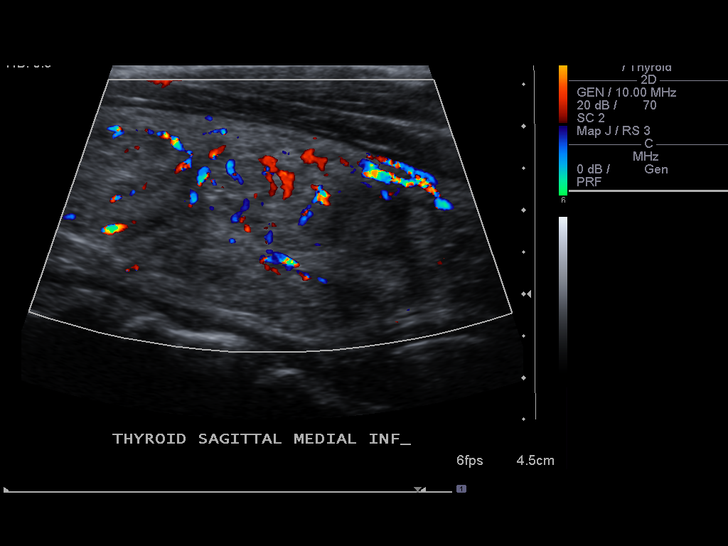
[im 39/59]
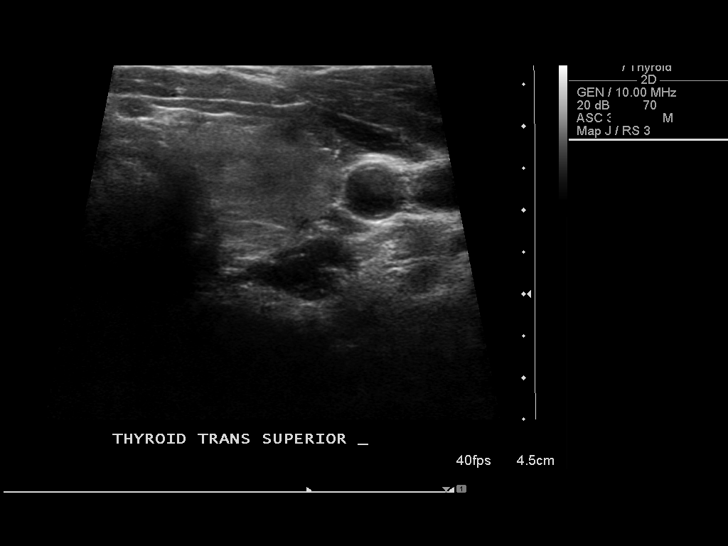
[im 44/59]
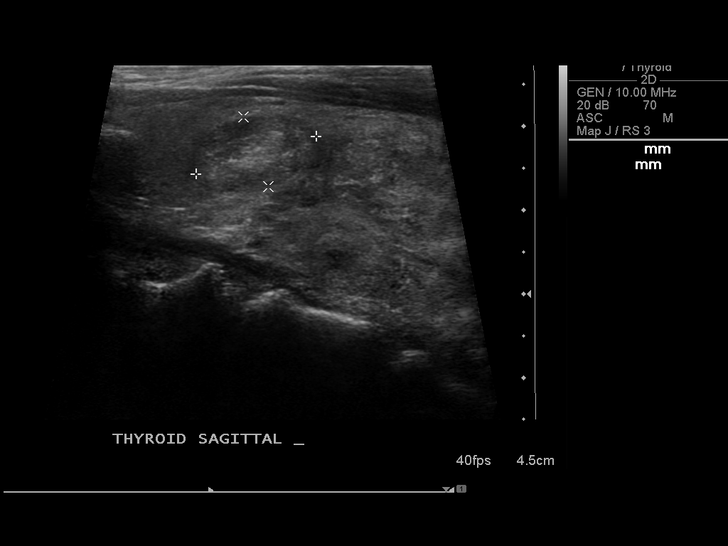
[im 49/59]
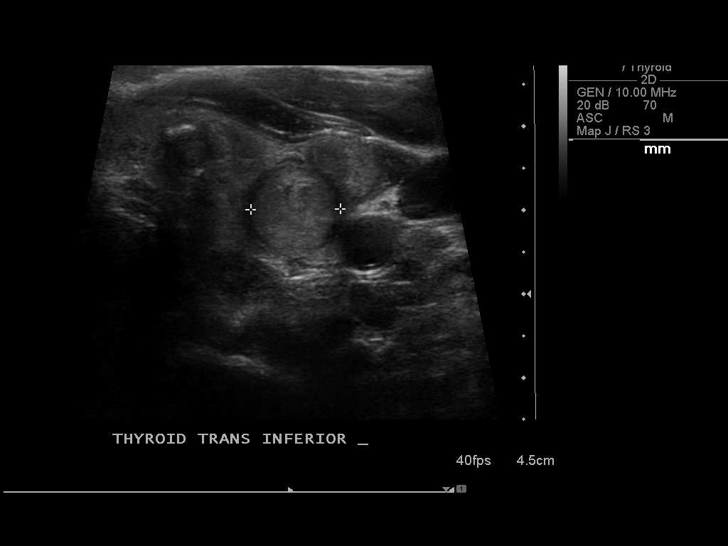
[im 54/59]
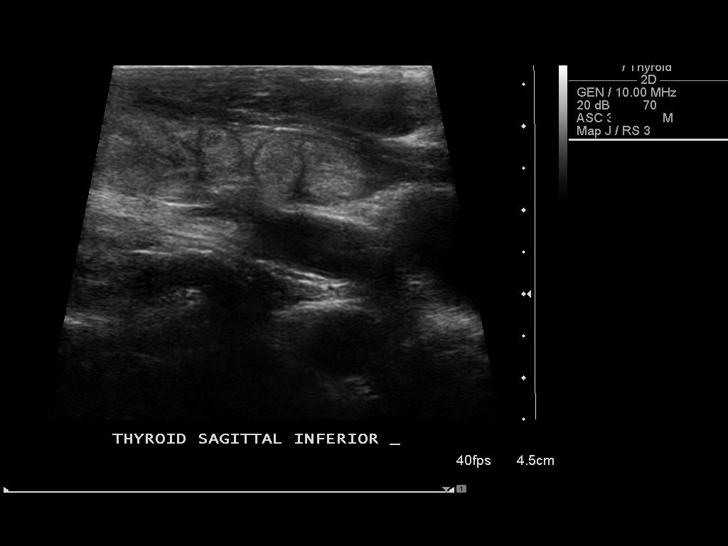
[im 59/59]
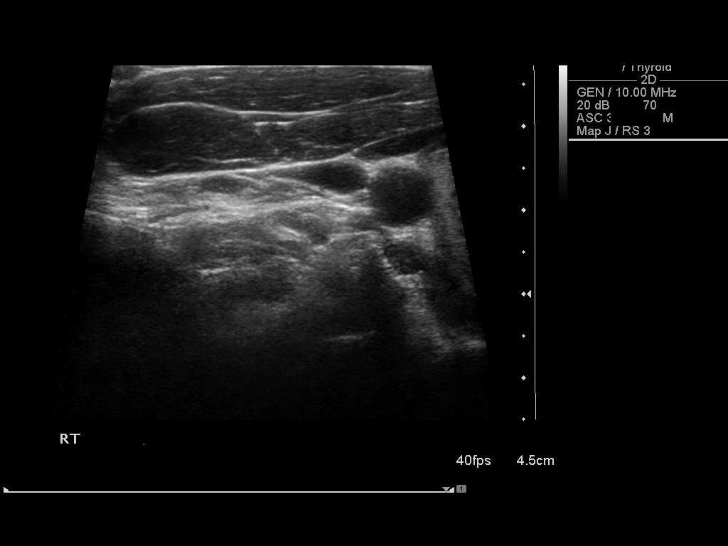

[14 of 25 positions shown; findings below may reference images not displayed]

FINDINGS: Right thyroid lobe:  6.8 x 1.9 x 2.4 cm.  Heterogeneous
echotexture.
Left thyroid lobe:  6.5 x 2.4 x 2.9 cm.  Heterogeneous echotexture.
Isthmus:  7 mm in thickness.

Focal nodules:  Nodules are scattered throughout both lobes of the
thyroid gland.  There is a predominately solid 1.9 x 1.3 x 1.6 cm
nodule in the interpolar region of the right lobe.  An additional
right lobe lesion extending into the isthmus measures 2.1 x 1.1 x
1.7 cm.  These are heterogeneous without suspicious calcifications.
In the left lobe, the dominant lesion is 2.3 x 1.2 x 1.7 cm.
Medial to the dominant nodule, there is a 1.0 x 0.8 x 0.9 cm nodule
with a calcified rim.

Lymphadenopathy:  None visualized.
IMPRESSION: Bilateral nodules are seen throughout the thyroid gland.  Fine
needle aspiration of the dominant 2.3 cm left lobe nodule and
cm right lobe nodule is recommended.

## 2012-12-23 IMAGING — US US THYROID BIOPSY
1 series · 13 of 13 positions shown · non-contrast
Comparison: 05/25/2011

CLINICAL DATA: Dominant bilateral thyroid nodules.

ULTRASOUND GUIDED NEEDLE ASPIRATE BIOPSY OF THE THYROID GLAND X 2

[Series 1: us thyroid biopsy · 0.07mm/px · 13 acquisitions, 13 frames shown]
[im 1/13]
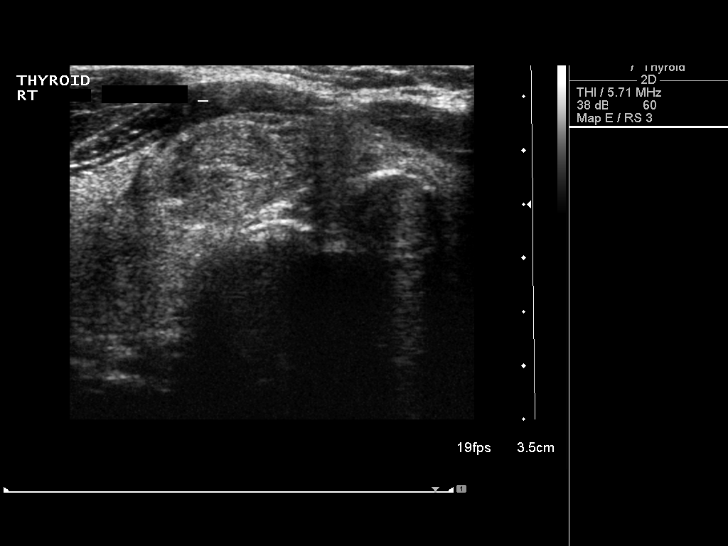
[im 2/13]
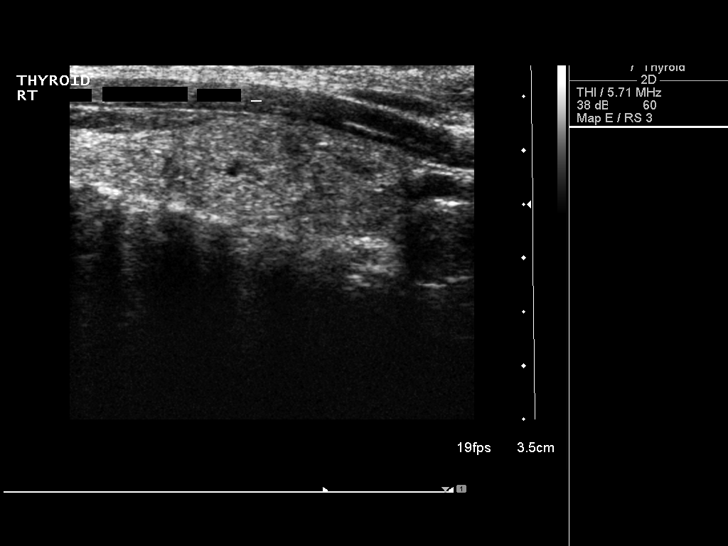
[im 3/13]
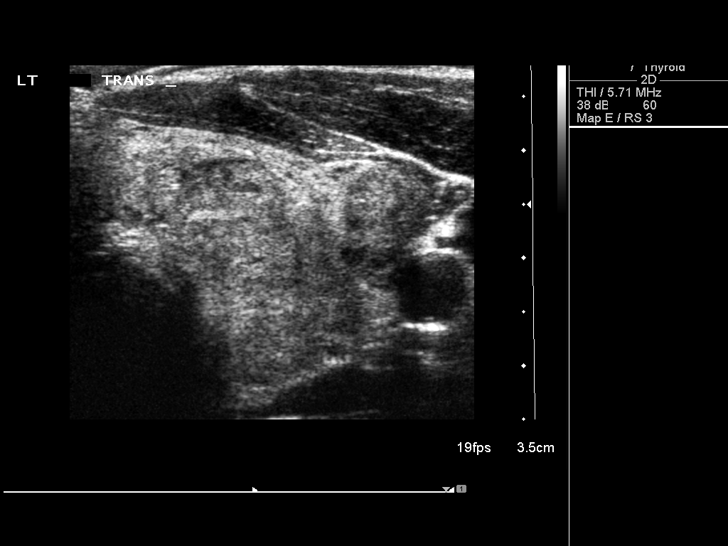
[im 4/13]
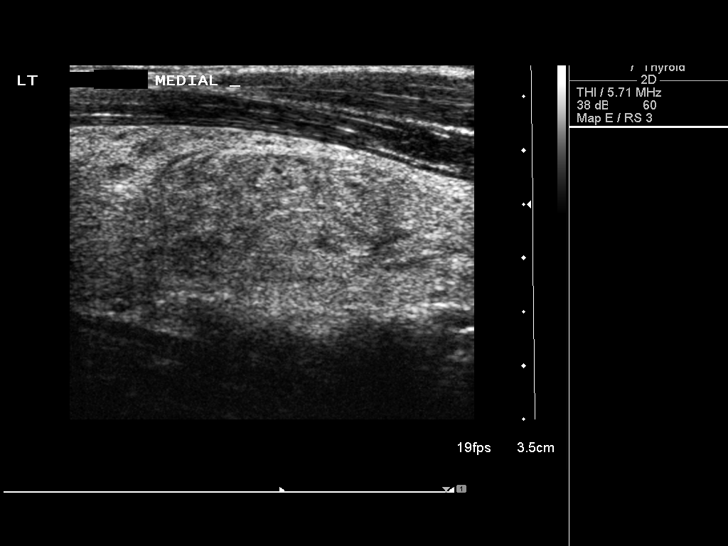
[im 5/13]
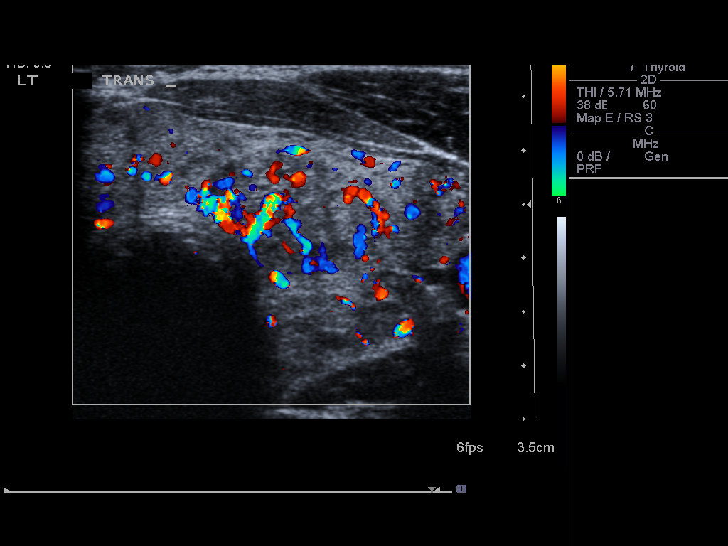
[im 6/13]
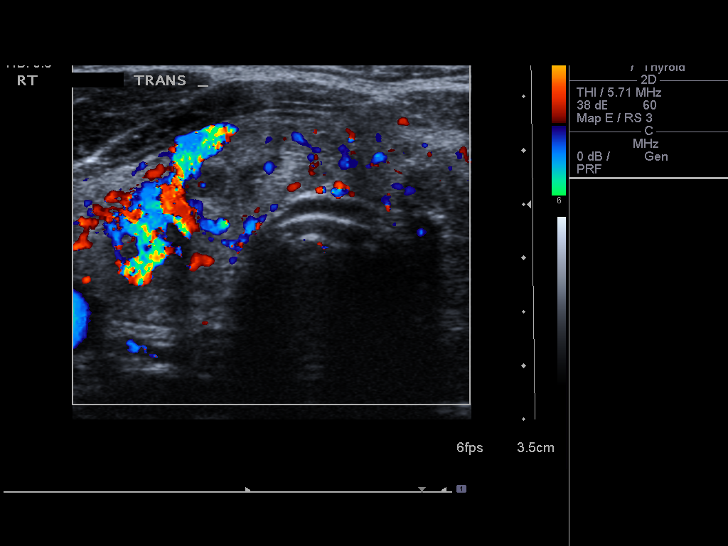
[im 7/13]
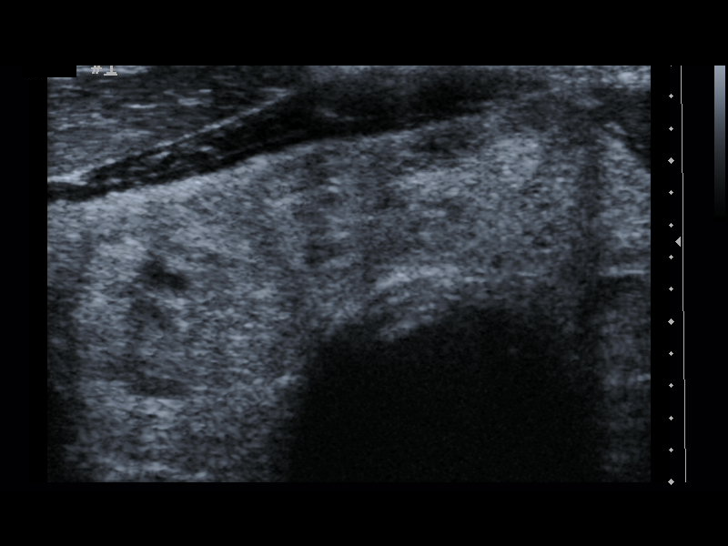
[im 8/13]
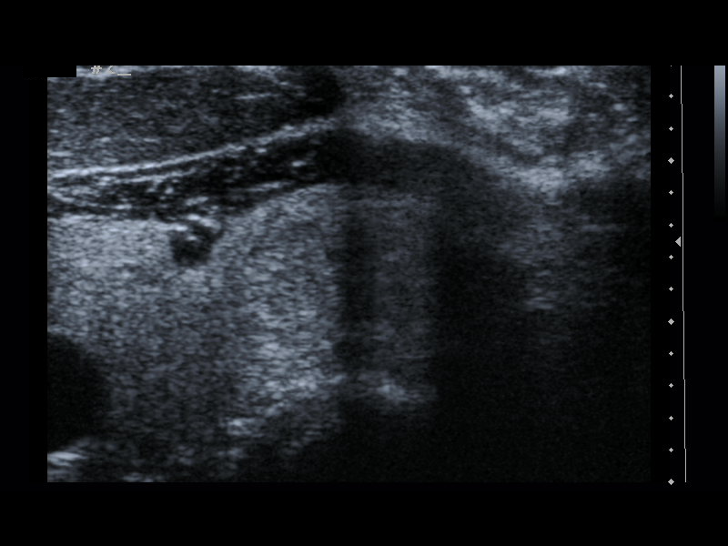
[im 9/13]
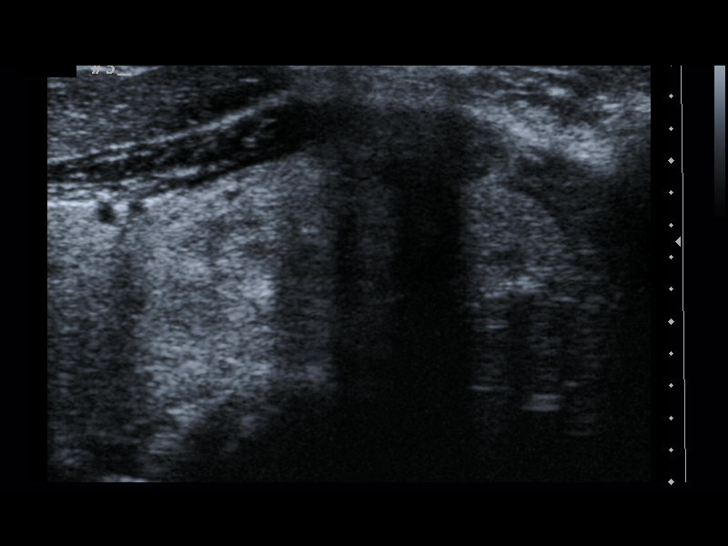
[im 10/13]
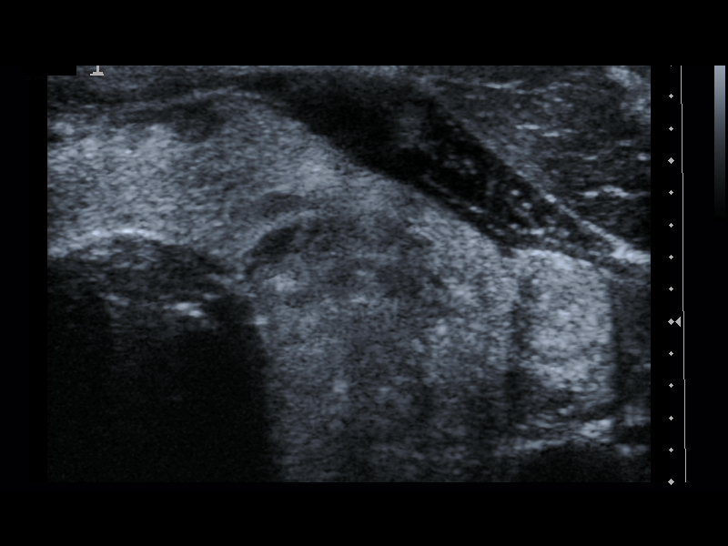
[im 11/13]
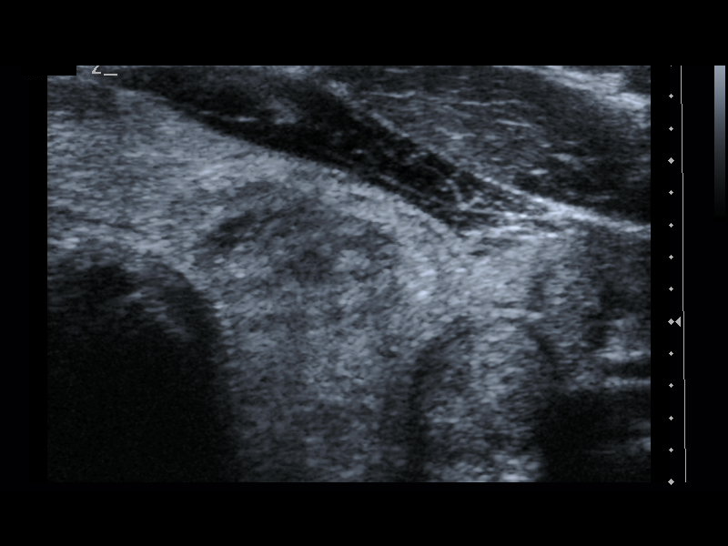
[im 12/13]
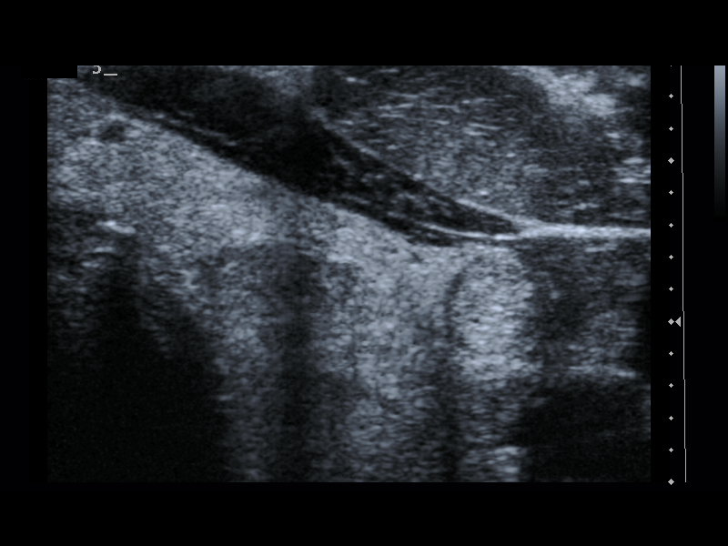
[im 13/13]
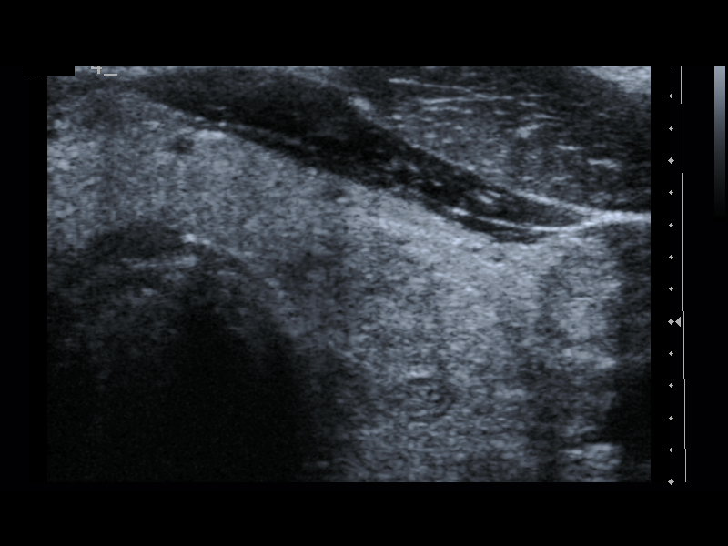

[13 of 13 positions shown; findings below may reference images not displayed]

Thyroid biopsy was thoroughly discussed with the patient and
questions were answered.  The benefits, risks, alternatives, and
complications were also discussed.  The patient understands and
wishes to proceed with the procedure.  Written consent was
obtained.

Ultrasound was performed to localize and mark an adequate site for
the biopsy.  The patient was then prepped and draped in a normal
sterile fashion.  Local anesthesia was provided with 1% lidocaine.
Using direct ultrasound guidance, 3 passes were made using 25 gauge
needles into the nodule within the right lobe of the thyroid.

Ultrasound guided needle aspiration biopsy was then performed of
the left thyroid nodule with 4 passes utilizing 25 gauge needles.

Ultrasound was used to confirm needle placements on all occasions.
Specimens were sent to Pathology for analysis.

Complications:  None
FINDINGS: The largest right lobe nodule extending into the isthmus
was sampled.  The dominant left lobe nodule was also sampled.
IMPRESSION: Ultrasound guided needle aspirate biopsy performed of bilateral
dominant thyroid nodules.

## 2013-01-27 ENCOUNTER — Encounter: Payer: Self-pay | Admitting: Family Medicine

## 2013-01-27 ENCOUNTER — Other Ambulatory Visit: Payer: Self-pay | Admitting: Family Medicine

## 2013-01-27 ENCOUNTER — Ambulatory Visit (INDEPENDENT_AMBULATORY_CARE_PROVIDER_SITE_OTHER): Payer: 59 | Admitting: Family Medicine

## 2013-01-27 VITALS — BP 136/88 | HR 80 | Temp 99.4°F | Wt 160.0 lb

## 2013-01-27 DIAGNOSIS — B351 Tinea unguium: Secondary | ICD-10-CM

## 2013-01-27 DIAGNOSIS — E039 Hypothyroidism, unspecified: Secondary | ICD-10-CM

## 2013-01-27 MED ORDER — TERBINAFINE HCL 250 MG PO TABS: 250.0000 mg | ORAL_TABLET | Freq: Every day | ORAL | Status: DC

## 2013-01-27 MED ORDER — PANTOPRAZOLE SODIUM 40 MG PO TBEC: 40.0000 mg | DELAYED_RELEASE_TABLET | Freq: Every day | ORAL | Status: DC

## 2013-01-27 MED ORDER — LEVOTHYROXINE SODIUM 25 MCG PO TABS: 25.0000 ug | ORAL_TABLET | Freq: Every day | ORAL | Status: DC

## 2013-01-27 NOTE — Telephone Encounter (Signed)
Can we refill this? 

## 2013-01-27 NOTE — Progress Notes (Signed)
  Subjective:    Patient ID: Brenda Barrett, female    DOB: July 17, 1970, 42 y.o.   MRN: [id number]  HPI Here for a green nail on the right great toe. It is not painful nor does she have any other symptoms. She thinks it was not there 3 weeks ago when she had her last coat of toenail polish applied. She noticed it yesterday as she removed the polish. No recent trauma. She also needs refills for Synthroid. She feels great in general.    Review of Systems  Constitutional: Negative.   Skin: Positive for color change.       Objective:   Physical Exam  Constitutional: She appears well-developed and well-nourished.  Neck: Neck supple. No thyromegaly present.  Cardiovascular: Normal rate, regular rhythm, normal heart sounds and intact distal pulses.   Pulmonary/Chest: Effort normal and breath sounds normal.  Lymphadenopathy:    She has no cervical adenopathy.  Skin:  The right great toenail has fungal involvement, as does the right 2nd toenail. They are thickened with crumbling underneath. The great toenail has a yellowish greenish color. Not tender           Assessment & Plan:  Treat with Terbinafine. Refilled Synthroid. Check a TSH

## 2013-01-30 NOTE — Progress Notes (Signed)
Quick Note:  I spoke with pt ______ 

## 2013-02-08 ENCOUNTER — Other Ambulatory Visit: Payer: Self-pay

## 2013-06-05 ENCOUNTER — Encounter (HOSPITAL_COMMUNITY): Payer: Self-pay | Admitting: Emergency Medicine

## 2013-06-05 ENCOUNTER — Emergency Department (INDEPENDENT_AMBULATORY_CARE_PROVIDER_SITE_OTHER)
Admission: EM | Admit: 2013-06-05 | Discharge: 2013-06-05 | Disposition: A | Discharge status: Home / Self Care | Payer: 59 | Source: Home / Self Care | Attending: Emergency Medicine | Admitting: Emergency Medicine

## 2013-06-05 DIAGNOSIS — M79609 Pain in unspecified limb: Secondary | ICD-10-CM

## 2013-06-05 DIAGNOSIS — S86002A Unspecified injury of left Achilles tendon, initial encounter: Secondary | ICD-10-CM

## 2013-06-05 DIAGNOSIS — M79605 Pain in left leg: Secondary | ICD-10-CM

## 2013-06-05 DIAGNOSIS — S8990XA Unspecified injury of unspecified lower leg, initial encounter: Secondary | ICD-10-CM

## 2013-06-05 DIAGNOSIS — M7989 Other specified soft tissue disorders: Secondary | ICD-10-CM

## 2013-06-05 MED ORDER — HYDROCODONE-ACETAMINOPHEN 5-325 MG PO TABS: 1.0000 | ORAL_TABLET | ORAL | Status: DC | PRN

## 2013-06-05 MED ORDER — NAPROXEN 500 MG PO TABS: 500.0000 mg | ORAL_TABLET | Freq: Two times a day (BID) | ORAL | Status: DC

## 2013-06-05 NOTE — ED Notes (Signed)
Reports running up hill last Monday and heard a pop. C/o pain in left leg.  States "think I have a muscle strain" .  Pt has been using icy hot with mild relief.

## 2013-06-05 NOTE — ED Provider Notes (Signed)
CSN: [id number]     Arrival date & time 06/05/13  1752 History   None    Chief Complaint  Patient presents with  . Leg Pain   (Consider location/radiation/quality/duration/timing/severity/associated sxs/prior Treatment) HPI Comments: 41 year old female presents complaining of pain in her left leg. This past Monday, one week ago, she was running up a hill and heard a pop in her left leg. She had immediate pain in the left leg, and has subsequently developed swelling in the leg. Since that time, she has had persistent pain in her leg and swelling. She has never experienced anything like this before. The pain is in her left calf only. The pain is increased with plantar flexion. She had no pain or swelling prior to this incident. She has no history of DVT or PE. She has no fever, chills, NVD, extremity numbness, extremity weakness.   Past Medical History  Diagnosis Date  . Depression   . GERD (gastroesophageal reflux disease)   . Hyperlipidemia   . Migraines   . Seasonal allergies    Past Surgical History  Procedure Laterality Date  . Tubal ligation     Family History  Problem Relation Age of Onset  . Diabetes      family hx  . Hyperlipidemia      family hx  . Stroke      family hx   History  Substance Use Topics  . Smoking status: Never Smoker   . Smokeless tobacco: Never Used  . Alcohol Use: No   OB History   Grav Para Term Preterm Abortions TAB SAB Ect Mult Living                 Review of Systems  Constitutional: Negative for fever and chills.  Eyes: Negative for visual disturbance.  Respiratory: Negative for cough and shortness of breath.   Cardiovascular: Positive for leg swelling. Negative for chest pain and palpitations.  Gastrointestinal: Negative for nausea, vomiting and abdominal pain.  Endocrine: Negative for polydipsia and polyuria.  Genitourinary: Negative for dysuria, urgency and frequency.  Musculoskeletal:       Leg pain and swelling  Skin: Negative  for rash.  Neurological: Negative for dizziness, weakness and light-headedness.    Allergies  Citalopram hydrobromide and Penicillins  Home Medications   Current Outpatient Rx  Name  Route  Sig  Dispense  Refill  . buPROPion (WELLBUTRIN XL) 300 MG 24 hr tablet      TAKE 1 TABLET BY MOUTH DAILY   30 tablet   5   . levothyroxine (SYNTHROID, LEVOTHROID) 25 MCG tablet   Oral   Take 1 tablet (25 mcg total) by mouth daily before breakfast.   30 tablet   11   . ALPRAZolam (XANAX) 1 MG tablet      TAKE 1 TABLET BY MOUTH 3 TIMES A DAY AS NEEDED FOR SLEEP OR ANXIETY   60 tablet   2   . EXPIRED: fluticasone (FLONASE) 50 MCG/ACT nasal spray   Nasal   Place 2 sprays into the nose daily.   16 g   0   . HYDROcodone-acetaminophen (NORCO) 5-325 MG per tablet   Oral   Take 1 tablet by mouth every 4 (four) hours as needed for moderate pain.   20 tablet   0   . HYDROcodone-acetaminophen (VICODIN) 5-500 MG per tablet   Oral   Take 1 tablet by mouth every 6 (six) hours as needed.           Marland Kitchen  naproxen (NAPROSYN) 500 MG tablet   Oral   Take 1 tablet (500 mg total) by mouth 2 (two) times daily.   60 tablet   0   . pantoprazole (PROTONIX) 40 MG tablet   Oral   Take 1 tablet (40 mg total) by mouth daily.   30 tablet   11   . SUMAtriptan (IMITREX) 100 MG tablet   Oral   Take 100 mg by mouth as needed.           . terbinafine (LAMISIL) 250 MG tablet   Oral   Take 1 tablet (250 mg total) by mouth daily.   90 tablet   2   . traMADol (ULTRAM) 50 MG tablet   Oral   Take 2 tablets (100 mg total) by mouth every 8 (eight) hours as needed for pain.   30 tablet   0    BP 133/74  Pulse 68  Temp(Src) 98.1 F (36.7 C) (Oral)  Resp 16  SpO2 100%  LMP 05/12/2013 Physical Exam  Nursing note and vitals reviewed. Constitutional: She is oriented to person, place, and time. Vital signs are normal. She appears well-developed and well-nourished. No distress.  HENT:  Head:  Normocephalic and atraumatic.  Cardiovascular: Intact distal pulses.   Pulmonary/Chest: Effort normal. No respiratory distress.  Musculoskeletal:       Left ankle: She exhibits swelling.       Left lower leg: She exhibits tenderness (in calf), swelling and edema. She exhibits no bony tenderness.       Legs:      Left foot: She exhibits swelling.  Neurological: She is alert and oriented to person, place, and time. She has normal strength. No sensory deficit. Coordination normal.  Skin: Skin is warm and dry. No rash noted. She is not diaphoretic.  Psychiatric: She has a normal mood and affect. Judgment normal.    ED Course  Procedures (including critical care time) Labs Review Labs Reviewed - No data to display Imaging Review No results found.    MDM   1. Achilles tendon injury, left, initial encounter   2. Left leg swelling   3. Left leg pain     Probable partial tear. Immobilizing, referring to sports medicine. Nonweightbearing or toe-touch weightbearing as tolerated for now. Followup with sports medicine   Meds ordered this encounter  Medications  . naproxen (NAPROSYN) 500 MG tablet    Sig: Take 1 tablet (500 mg total) by mouth 2 (two) times daily.    Dispense:  60 tablet    Refill:  0    Order Specific Question:  Supervising Provider    Answer:  Lorenz Coaster, DAVID C [id number]  . HYDROcodone-acetaminophen (NORCO) 5-325 MG per tablet    Sig: Take 1 tablet by mouth every 4 (four) hours as needed for moderate pain.    Dispense:  20 tablet    Refill:  0    Order Specific Question:  Supervising Provider    Answer:  Lorenz Coaster, DAVID C [[id number]]     Graylon Good, PA-C 06/06/13 1302

## 2013-06-06 NOTE — ED Provider Notes (Signed)
Medical screening examination/treatment/procedure(s) were performed by non-physician practitioner and as supervising physician I was immediately available for consultation/collaboration.  Leslee Home, M.D.  Reuben Likes, MD 06/06/13 2213

## 2013-06-28 ENCOUNTER — Other Ambulatory Visit: Payer: Self-pay | Admitting: Family Medicine

## 2013-08-28 ENCOUNTER — Ambulatory Visit (INDEPENDENT_AMBULATORY_CARE_PROVIDER_SITE_OTHER): Payer: 59 | Admitting: Family Medicine

## 2013-08-28 ENCOUNTER — Encounter: Payer: Self-pay | Admitting: Family Medicine

## 2013-08-28 VITALS — BP 138/86 | Temp 98.0°F | Ht 65.5 in | Wt 153.0 lb

## 2013-08-28 DIAGNOSIS — G43909 Migraine, unspecified, not intractable, without status migrainosus: Secondary | ICD-10-CM

## 2013-08-28 DIAGNOSIS — E039 Hypothyroidism, unspecified: Secondary | ICD-10-CM

## 2013-08-28 LAB — TSH: TSH: 0.97 u[IU]/mL (ref 0.35–5.50)

## 2013-08-28 MED ORDER — SUMATRIPTAN SUCCINATE 100 MG PO TABS: 100.0000 mg | ORAL_TABLET | ORAL | Status: DC | PRN

## 2013-08-28 MED ORDER — KETOROLAC TROMETHAMINE 60 MG/2ML IM SOLN
60.0000 mg | Freq: Once | INTRAMUSCULAR | Status: AC
  Administered 2013-08-28: 60 mg via INTRAMUSCULAR

## 2013-08-28 MED ORDER — TRAMADOL HCL 50 MG PO TABS: 100.0000 mg | ORAL_TABLET | Freq: Four times a day (QID) | ORAL | Status: DC | PRN

## 2013-08-28 NOTE — Addendum Note (Signed)
Addended by: Aniceto BossNIMMONS, Raymond Azure A on: 08/28/2013 04:15 PM   Modules accepted: Orders

## 2013-08-28 NOTE — Progress Notes (Signed)
Pre visit review using our clinic review tool, if applicable. No additional management support is needed unless otherwise documented below in the visit note. 

## 2013-08-28 NOTE — Progress Notes (Signed)
   Subjective:    Patient ID: Brenda Barrett, female    DOB: [birth year], 43 y.o.   MRN: [id number]  HPI Here for her migraines. She had been doing well until she developed a typical migraine headache about 4 days ago over the forehead and the temples. She has some nausea but has not vomited. She has light sensitivity.    Review of Systems  Constitutional: Negative.   Eyes: Positive for photophobia. Negative for pain, discharge, redness, itching and visual disturbance.  Respiratory: Negative.   Cardiovascular: Negative.   Neurological: Positive for headaches.       Objective:   Physical Exam  Constitutional: She is oriented to person, place, and time. She appears well-developed and well-nourished.  HENT:  Head: Normocephalic and atraumatic.  Eyes: Conjunctivae are normal. Pupils are equal, round, and reactive to light.  Neck: Neck supple. No thyromegaly present.  Cardiovascular: Normal rate, regular rhythm, normal heart sounds and intact distal pulses.   Pulmonary/Chest: Effort normal and breath sounds normal.  Lymphadenopathy:    She has no cervical adenopathy.  Neurological: She is alert and oriented to person, place, and time. No cranial nerve deficit.          Assessment & Plan:  For the acute migraine she is given a Toradol shot. Refilled Imitrex and Tramadol. Check a TSH today.

## 2013-11-16 ENCOUNTER — Ambulatory Visit (INDEPENDENT_AMBULATORY_CARE_PROVIDER_SITE_OTHER)
Admission: RE | Admit: 2013-11-16 | Discharge: 2013-11-16 | Disposition: A | Discharge status: Home / Self Care | Payer: 59 | Source: Ambulatory Visit | Attending: Family Medicine | Admitting: Family Medicine

## 2013-11-16 ENCOUNTER — Encounter: Payer: Self-pay | Admitting: Family Medicine

## 2013-11-16 ENCOUNTER — Ambulatory Visit (INDEPENDENT_AMBULATORY_CARE_PROVIDER_SITE_OTHER): Payer: 59 | Admitting: Family Medicine

## 2013-11-16 VITALS — BP 169/100 | HR 71 | Temp 98.8°F | Ht 65.5 in | Wt 156.0 lb

## 2013-11-16 DIAGNOSIS — S93409A Sprain of unspecified ligament of unspecified ankle, initial encounter: Secondary | ICD-10-CM
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
## 2013-11-16 NOTE — Progress Notes (Signed)
Pre visit review using our clinic review tool, if applicable. No additional management support is needed unless otherwise documented below in the visit note. 

## 2014-01-05 ENCOUNTER — Emergency Department (HOSPITAL_COMMUNITY)
Admission: EM | Admit: 2014-01-05 | Discharge: 2014-01-05 | Disposition: A | Discharge status: Home / Self Care | Payer: 59 | Source: Home / Self Care | Attending: Emergency Medicine | Admitting: Emergency Medicine

## 2014-01-05 NOTE — ED Notes (Signed)
Pt called in lobby x 1 

## 2014-01-23 ENCOUNTER — Encounter: Payer: Self-pay | Admitting: Family Medicine

## 2014-01-23 ENCOUNTER — Ambulatory Visit (INDEPENDENT_AMBULATORY_CARE_PROVIDER_SITE_OTHER): Payer: 59 | Admitting: Family Medicine

## 2014-01-23 VITALS — BP 164/89 | HR 67 | Temp 99.5°F | Ht 65.5 in | Wt 154.0 lb

## 2014-01-23 DIAGNOSIS — I10 Essential (primary) hypertension: Secondary | ICD-10-CM

## 2014-01-23 DIAGNOSIS — J0110 Acute frontal sinusitis, unspecified: Secondary | ICD-10-CM

## 2014-01-23 MED ORDER — AZITHROMYCIN 250 MG PO TABS: ORAL_TABLET | ORAL | Status: DC

## 2014-01-23 MED ORDER — LISINOPRIL 10 MG PO TABS: 10.0000 mg | ORAL_TABLET | Freq: Every day | ORAL | Status: DC

## 2014-01-23 NOTE — Progress Notes (Signed)
   Subjective:    Patient ID: Brenda Barrett, female    DOB: Dec 11, 1970, 43 y.o.   MRN: [id number]  HPI Here for 3 days of fever, neck pain, PND, fatigue, and a hoarse voice. No cough. Also we have watching her elevated BP for the past 6 months. She checks it at home once a week and it stays int eh 140s or 150s systolic.    Review of Systems  Constitutional: Positive for fever.  HENT: Positive for congestion and postnasal drip.   Eyes: Negative.   Respiratory: Negative.   Cardiovascular: Negative.        Objective:   Physical Exam  Constitutional: She appears well-developed and well-nourished.  HENT:  Right Ear: External ear normal.  Nose: Nose normal.  Mouth/Throat: Oropharynx is clear and moist.  Eyes: Conjunctivae are normal.  Cardiovascular: Normal rate, regular rhythm, normal heart sounds and intact distal pulses.   Pulmonary/Chest: Effort normal and breath sounds normal.  Musculoskeletal: She exhibits no edema.  Lymphadenopathy:    She has no cervical adenopathy.          Assessment & Plan:  Given a Zpack. Start on Lisinopril 10 mg daily. Watch the dietary sodium. Get more exercise. Recheck one month

## 2014-01-23 NOTE — Progress Notes (Signed)
Pre visit review using our clinic review tool, if applicable. No additional management support is needed unless otherwise documented below in the visit note. 

## 2014-02-09 ENCOUNTER — Other Ambulatory Visit: Payer: Self-pay | Admitting: Family Medicine

## 2014-02-26 ENCOUNTER — Ambulatory Visit (INDEPENDENT_AMBULATORY_CARE_PROVIDER_SITE_OTHER): Payer: 59 | Admitting: Family Medicine

## 2014-02-26 ENCOUNTER — Encounter: Payer: Self-pay | Admitting: Family Medicine

## 2014-02-26 VITALS — BP 134/84 | HR 77 | Temp 98.5°F | Ht 65.5 in | Wt 159.0 lb

## 2014-02-26 DIAGNOSIS — E039 Hypothyroidism, unspecified: Secondary | ICD-10-CM

## 2014-02-26 DIAGNOSIS — I10 Essential (primary) hypertension: Secondary | ICD-10-CM

## 2014-02-26 DIAGNOSIS — G43909 Migraine, unspecified, not intractable, without status migrainosus: Secondary | ICD-10-CM

## 2014-02-26 MED ORDER — AMLODIPINE BESYLATE 5 MG PO TABS: 5.0000 mg | ORAL_TABLET | Freq: Every day | ORAL | Status: DC

## 2014-02-26 NOTE — Progress Notes (Signed)
   Subjective:    Patient ID: Brenda Barrett, female    DOB: 1971-04-22, 43 y.o.   MRN: [id number]  HPI Here for several weeks of generalized fatigue. No other specific sx. She was started on Lisinopril for her HTN 4 weeks ago, and this has worked well to get her BP down.    Review of Systems  Constitutional: Positive for fatigue.  Respiratory: Negative.   Cardiovascular: Negative.   Endocrine: Negative.        Objective:   Physical Exam  Constitutional: She is oriented to person, place, and time. She appears well-developed and well-nourished.  Neck: No thyromegaly present.  Cardiovascular: Normal rate, regular rhythm, normal heart sounds and intact distal pulses.   Pulmonary/Chest: Effort normal and breath sounds normal.  Lymphadenopathy:    She has no cervical adenopathy.  Neurological: She is alert and oriented to person, place, and time.  Psychiatric: She has a normal mood and affect. Her behavior is normal. Thought content normal.          Assessment & Plan:  We agreed that her fatigue may be a side effect of the Lisinopril so we will stop this and try Amlodipine 5 mg daily. Get labs today and recheck in one month.

## 2014-02-26 NOTE — Progress Notes (Signed)
Pre visit review using our clinic review tool, if applicable. No additional management support is needed unless otherwise documented below in the visit note. 

## 2014-02-27 LAB — BASIC METABOLIC PANEL
BUN: 11 mg/dL (ref 6–23)
CALCIUM: 8.8 mg/dL (ref 8.4–10.5)
CO2: 26 mEq/L (ref 19–32)
CREATININE: 1 mg/dL (ref 0.4–1.2)
Chloride: 104 mEq/L (ref 96–112)
GFR: 79.74 mL/min (ref >60.00)
Glucose, Bld: 94 mg/dL (ref 70–99)
POTASSIUM: 4.1 meq/L (ref 3.5–5.1)
Sodium: 137 mEq/L (ref 135–145)

## 2014-02-27 LAB — HEPATIC FUNCTION PANEL
ALT: 15 U/L (ref 0–35)
AST: 20 U/L (ref 0–37)
Albumin: 3.8 g/dL (ref 3.5–5.2)
Alkaline Phosphatase: 67 U/L (ref 39–117)
BILIRUBIN TOTAL: 0.3 mg/dL (ref 0.2–1.2)
Bilirubin, Direct: 0 mg/dL (ref 0.0–0.3)
Total Protein: 7.6 g/dL (ref 6.0–8.3)

## 2014-02-27 LAB — CBC WITH DIFFERENTIAL/PLATELET
BASOS ABS: 0 10*3/uL (ref 0.0–0.1)
Basophils Relative: 0.8 % (ref 0.0–3.0)
EOS ABS: 0.2 10*3/uL (ref 0.0–0.7)
EOS PCT: 3.9 % (ref 0.0–5.0)
HEMATOCRIT: 33.3 % — AB (ref 36.0–46.0)
HEMOGLOBIN: 10.9 g/dL — AB (ref 12.0–15.0)
LYMPHS ABS: 2.6 10*3/uL (ref 0.7–4.0)
LYMPHS PCT: 46 % (ref 12.0–46.0)
MCHC: 32.7 g/dL (ref 30.0–36.0)
MCV: 87.7 fl (ref 78.0–100.0)
MONOS PCT: 6.2 % (ref 3.0–12.0)
Monocytes Absolute: 0.3 10*3/uL (ref 0.1–1.0)
NEUTROS ABS: 2.4 10*3/uL (ref 1.4–7.7)
Neutrophils Relative %: 43.1 % (ref 43.0–77.0)
Platelets: 223 10*3/uL (ref 150.0–400.0)
RBC: 3.8 Mil/uL — AB (ref 3.87–5.11)
RDW: 12.9 % (ref 11.5–15.5)
WBC: 5.6 10*3/uL (ref 4.0–10.5)

## 2014-02-27 LAB — TSH: TSH: 0.35 u[IU]/mL (ref 0.35–4.50)

## 2014-06-14 ENCOUNTER — Ambulatory Visit (INDEPENDENT_AMBULATORY_CARE_PROVIDER_SITE_OTHER): Payer: 59 | Admitting: Family Medicine

## 2014-06-14 ENCOUNTER — Encounter: Payer: Self-pay | Admitting: Family Medicine

## 2014-06-14 VITALS — BP 132/92 | HR 77 | Temp 97.9°F | Ht 65.5 in | Wt 162.8 lb

## 2014-06-14 DIAGNOSIS — B001 Herpesviral vesicular dermatitis: Secondary | ICD-10-CM

## 2014-06-14 DIAGNOSIS — I10 Essential (primary) hypertension: Secondary | ICD-10-CM

## 2014-06-14 MED ORDER — VALACYCLOVIR HCL 500 MG PO TABS: 2000.0000 mg | ORAL_TABLET | Freq: Two times a day (BID) | ORAL | Status: DC

## 2014-06-14 NOTE — Patient Instructions (Signed)
Before you leave: -schedule follow up with Dr. Clent RidgesFry to recheck your blood pressure - please take your blood pressure medications that day  Cold Sore A cold sore (fever blister) is a skin infection caused by the herpes simplex virus (HSV-1). HSV-1 is closely related to the virus that causes genital herpes (HSV-2), but they are not the same even though both viruses can cause oral and genital infections. Cold sores are small, fluid-filled sores inside of the mouth or on the lips, gums, nose, chin, cheeks, or fingers.  The herpes simplex virus can be easily passed (contagious) to other people through close personal contact, such as kissing or sharing personal items. The virus can also spread to other parts of the body, such as the eyes or genitals. Cold sores are contagious until the sores crust over completely. They often heal within 2 weeks.  Once a person is infected, the herpes simplex virus remains permanently in the body. Therefore, there is no cure for cold sores, and they often recur when a person is tired, stressed, sick, or gets too much sun. Additional factors that can cause a recurrence include hormone changes in menstruation or pregnancy, certain drugs, and cold weather.  CAUSES  Cold sores are caused by the herpes simplex virus. The virus is spread from person to person through close contact, such as through kissing, touching the affected area, or sharing personal items such as lip balm, razors, or eating utensils.  SYMPTOMS  The first infection may not cause symptoms. If symptoms develop, the symptoms often go through different stages. Here is how a cold sore develops:   Tingling, itching, or burning is felt 1-2 days before the outbreak.   Fluid-filled blisters appear on the lips, inside the mouth, nose, or on the cheeks.   The blisters start to ooze clear fluid.   The blisters dry up and a yellow crust appears in its place.   The crust falls off.  Symptoms depend on whether it is  the initial outbreak or a recurrence. Some other symptoms with the first outbreak may include:   Fever.   Sore throat.   Headache.   Muscle aches.   Swollen neck glands.  DIAGNOSIS  A diagnosis is often made based on your symptoms and looking at the sores. Sometimes, a sore may be swabbed and then examined in the lab to make a final diagnosis. If the sores are not present, blood tests can find the herpes simplex virus.  TREATMENT  There is no cure for cold sores and no vaccine for the herpes simplex virus. Within 2 weeks, most cold sores go away on their own without treatment. Medicines cannot make the infection go away, but medicine can help relieve some of the pain associated with the sores, can work to stop the virus from multiplying, and can also shorten healing time. Medicine may be in the form of creams, gels, pills, or a shot.  HOME CARE INSTRUCTIONS   Only take over-the-counter or prescription medicines for pain, discomfort, or fever as directed by your caregiver. Do not use aspirin.   Use a cotton-tip swab to apply creams or gels to your sores.   Do not touch the sores or pick the scabs. Wash your hands often. Do not touch your eyes without washing your hands first.   Avoid kissing, oral sex, and sharing personal items until sores heal.   Apply an ice pack on your sores for 10-15 minutes to ease any discomfort.   Avoid hot, cold,  or salty foods because they may hurt your mouth. Eat a soft, bland diet to avoid irritating the sores. Use a straw to drink if you have pain when drinking out of a glass.   Keep sores clean and dry to prevent an infection of other tissues.   Avoid the sun and limit stress if these things trigger outbreaks. If sun causes cold sores, apply sunscreen on the lips before being out in the sun.  SEEK MEDICAL CARE IF:   You have a fever or persistent symptoms for more than 2-3 days.   You have a fever and your symptoms suddenly get worse.    You have pus, not clear fluid, coming from the sores.   You have redness that is spreading.   You have pain or irritation in your eye.   You get sores on your genitals.   Your sores do not heal within 2 weeks.   You have a weakened immune system.   You have frequent recurrences of cold sores.  MAKE SURE YOU:   Understand these instructions.  Will watch your condition.  Will get help right away if you are not doing well or get worse. Document Released: 06/19/2000 Document Revised: 11/06/2013 Document Reviewed: 11/04/2011 [facility] Patient Information 2015 [employer], [place]. This information is not intended to replace advice given to you by your health care provider. Make sure you discuss any questions you have with your health care provider.

## 2014-06-14 NOTE — Progress Notes (Signed)
Pre visit review using our clinic review tool, if applicable. No additional management support is needed unless otherwise documented below in the visit note. 

## 2014-06-14 NOTE — Progress Notes (Signed)
HPI:  Cold Sore: -stared 2 days ago -very stressed about this -sore, tried abreva which helped a little -denies: fevers, chills, HA   ROS: See pertinent positives and negatives per HPI.  Past Medical History  Diagnosis Date  . Depression   . GERD (gastroesophageal reflux disease)   . Hyperlipidemia   . Migraines   . Seasonal allergies     Past Surgical History  Procedure Laterality Date  . Tubal ligation      Family History  Problem Relation Age of Onset  . Diabetes      family hx  . Hyperlipidemia      family hx  . Stroke      family hx    History   Social History  . Marital Status: Divorced    Spouse Name: N/A    Number of Children: N/A  . Years of Education: N/A   Social History Main Topics  . Smoking status: Never Smoker   . Smokeless tobacco: Never Used  . Alcohol Use: No  . Drug Use: No  . Sexual Activity: Yes    Birth Control/ Protection: Condom   Other Topics Concern  . None   Social History Narrative    Current outpatient prescriptions: ALPRAZolam (XANAX) 1 MG tablet, TAKE 1 TABLET BY MOUTH 3 TIMES A DAY AS NEEDED, Disp: , Rfl: ;  amLODipine (NORVASC) 5 MG tablet, Take 1 tablet (5 mg total) by mouth daily., Disp: 30 tablet, Rfl: 11;  buPROPion (WELLBUTRIN XL) 300 MG 24 hr tablet, TAKE 1 TABLET BY MOUTH DAILY, Disp: 30 tablet, Rfl: 5 levothyroxine (SYNTHROID, LEVOTHROID) 25 MCG tablet, TAKE 1 TABLET (25 MCG TOTAL) BY MOUTH DAILY BEFORE BREAKFAST., Disp: 30 tablet, Rfl: 6;  valACYclovir (VALTREX) 500 MG tablet, Take 4 tablets (2,000 mg total) by mouth 2 (two) times daily., Disp: 8 tablet, Rfl: 0  EXAM:  Filed Vitals:   06/14/14 1330  BP: 132/92  Pulse: 77  Temp: 97.9 F (36.6 C)    Body mass index is 26.67 kg/(m^2).  GENERAL: vitals reviewed and listed above, alert, oriented, appears well hydrated and in no acute distress  HEENT: atraumatic, conjunttiva clear, no obvious abnormalities on inspection of external nose and ears  NECK:  no obvious masses on inspection   LUNGS: clear to auscultation bilaterally, no wheezes, rales or rhonchi, good air movement  CV: HRRR, no peripheral edema  SKIN: mildly crusted papulovesicular lesion R oral commisure  MS: moves all extremities without noticeable abnormality  PSYCH: pleasant and cooperative, no obvious depression or anxiety  ASSESSMENT AND PLAN:  Discussed the following assessment and plan:  Cold sore - Plan: valACYclovir (VALTREX) 500 MG tablet, DISCONTINUED: valACYclovir (VALTREX) 500 MG tablet -discussed cause, transmission, tx options, other potential etiologies, tx risks, return precuations  Essential hypertension -BP improved on recheck and pt did not take BP medication today and is anxious - advised close follow up with PCP to recheck/adjust medication if needed -Patient advised to return or notify a doctor immediately if symptoms worsen or persist or new concerns arise.  Patient Instructions  Before you leave: -schedule follow up with Dr. Clent Ridges to recheck your blood pressure - please take your blood pressure medications that day  Cold Sore A cold sore (fever blister) is a skin infection caused by the herpes simplex virus (HSV-1). HSV-1 is closely related to the virus that causes genital herpes (HSV-2), but they are not the same even though both viruses can cause oral and genital infections. Cold sores are  small, fluid-filled sores inside of the mouth or on the lips, gums, nose, chin, cheeks, or fingers.  The herpes simplex virus can be easily passed (contagious) to other people through close personal contact, such as kissing or sharing personal items. The virus can also spread to other parts of the body, such as the eyes or genitals. Cold sores are contagious until the sores crust over completely. They often heal within 2 weeks.  Once a person is infected, the herpes simplex virus remains permanently in the body. Therefore, there is no cure for cold sores, and they  often recur when a person is tired, stressed, sick, or gets too much sun. Additional factors that can cause a recurrence include hormone changes in menstruation or pregnancy, certain drugs, and cold weather.  CAUSES  Cold sores are caused by the herpes simplex virus. The virus is spread from person to person through close contact, such as through kissing, touching the affected area, or sharing personal items such as lip balm, razors, or eating utensils.  SYMPTOMS  The first infection may not cause symptoms. If symptoms develop, the symptoms often go through different stages. Here is how a cold sore develops:   Tingling, itching, or burning is felt 1-2 days before the outbreak.   Fluid-filled blisters appear on the lips, inside the mouth, nose, or on the cheeks.   The blisters start to ooze clear fluid.   The blisters dry up and a yellow crust appears in its place.   The crust falls off.  Symptoms depend on whether it is the initial outbreak or a recurrence. Some other symptoms with the first outbreak may include:   Fever.   Sore throat.   Headache.   Muscle aches.   Swollen neck glands.  DIAGNOSIS  A diagnosis is often made based on your symptoms and looking at the sores. Sometimes, a sore may be swabbed and then examined in the lab to make a final diagnosis. If the sores are not present, blood tests can find the herpes simplex virus.  TREATMENT  There is no cure for cold sores and no vaccine for the herpes simplex virus. Within 2 weeks, most cold sores go away on their own without treatment. Medicines cannot make the infection go away, but medicine can help relieve some of the pain associated with the sores, can work to stop the virus from multiplying, and can also shorten healing time. Medicine may be in the form of creams, gels, pills, or a shot.  HOME CARE INSTRUCTIONS   Only take over-the-counter or prescription medicines for pain, discomfort, or fever as directed by  your caregiver. Do not use aspirin.   Use a cotton-tip swab to apply creams or gels to your sores.   Do not touch the sores or pick the scabs. Wash your hands often. Do not touch your eyes without washing your hands first.   Avoid kissing, oral sex, and sharing personal items until sores heal.   Apply an ice pack on your sores for 10-15 minutes to ease any discomfort.   Avoid hot, cold, or salty foods because they may hurt your mouth. Eat a soft, bland diet to avoid irritating the sores. Use a straw to drink if you have pain when drinking out of a glass.   Keep sores clean and dry to prevent an infection of other tissues.   Avoid the sun and limit stress if these things trigger outbreaks. If sun causes cold sores, apply sunscreen on the lips before  being out in the sun.  SEEK MEDICAL CARE IF:   You have a fever or persistent symptoms for more than 2-3 days.   You have a fever and your symptoms suddenly get worse.   You have pus, not clear fluid, coming from the sores.   You have redness that is spreading.   You have pain or irritation in your eye.   You get sores on your genitals.   Your sores do not heal within 2 weeks.   You have a weakened immune system.   You have frequent recurrences of cold sores.  MAKE SURE YOU:   Understand these instructions.  Will watch your condition.  Will get help right away if you are not doing well or get worse. Document Released: 06/19/2000 Document Revised: 11/06/2013 Document Reviewed: 11/04/2011 [facility] Patient Information 2015 [place], [place]. This information is not intended to replace advice given to you by your health care provider. Make sure you discuss any questions you have with your health care provider.      Kriste BasqueKIM, Jerald Villalona R.

## 2014-06-15 ENCOUNTER — Telehealth: Payer: Self-pay | Admitting: Family Medicine

## 2014-06-15 NOTE — Telephone Encounter (Signed)
emmi emailed °

## 2014-06-18 ENCOUNTER — Other Ambulatory Visit: Payer: Self-pay

## 2014-06-18 NOTE — Telephone Encounter (Signed)
Rx request for Bupropion HCL XL 300 mg tablet- Take 1 tablet by mouth daily #30  Pharm:  [facility] [place]   Pls advise.

## 2014-06-19 MED ORDER — BUPROPION HCL ER (XL) 300 MG PO TB24: 300.0000 mg | ORAL_TABLET | Freq: Every day | ORAL | Status: DC

## 2014-06-19 NOTE — Telephone Encounter (Signed)
Rx sent to pharmacy   

## 2014-06-19 NOTE — Telephone Encounter (Signed)
Call in #30 with 11 rf 

## 2014-07-18 ENCOUNTER — Telehealth: Payer: Self-pay | Admitting: Family Medicine

## 2014-07-18 ENCOUNTER — Ambulatory Visit: Payer: Self-pay | Admitting: Family Medicine

## 2014-07-18 NOTE — Telephone Encounter (Signed)
Pt was on the schedule to see Dr. Clent RidgesFry this morning. I called and left a voice mail for pt to reschedule as needed.

## 2014-10-04 ENCOUNTER — Emergency Department (INDEPENDENT_AMBULATORY_CARE_PROVIDER_SITE_OTHER)
Admission: EM | Admit: 2014-10-04 | Discharge: 2014-10-04 | Disposition: A | Discharge status: Home / Self Care | Payer: 59 | Source: Home / Self Care | Attending: Emergency Medicine | Admitting: Emergency Medicine

## 2014-10-04 ENCOUNTER — Encounter (HOSPITAL_COMMUNITY): Payer: Self-pay | Admitting: Emergency Medicine

## 2014-10-04 ENCOUNTER — Ambulatory Visit: Payer: 59 | Admitting: Internal Medicine

## 2014-10-04 DIAGNOSIS — G43009 Migraine without aura, not intractable, without status migrainosus: Secondary | ICD-10-CM

## 2014-10-04 MED ORDER — KETOROLAC TROMETHAMINE 60 MG/2ML IM SOLN
60.0000 mg | Freq: Once | INTRAMUSCULAR | Status: AC
  Administered 2014-10-04: 60 mg via INTRAMUSCULAR

## 2014-10-04 MED ORDER — ONDANSETRON HCL 4 MG PO TABS: 4.0000 mg | ORAL_TABLET | Freq: Four times a day (QID) | ORAL | Status: DC

## 2014-10-04 MED ORDER — ONDANSETRON 4 MG PO TBDP
4.0000 mg | ORAL_TABLET | Freq: Once | ORAL | Status: AC
  Administered 2014-10-04: 4 mg via ORAL

## 2014-10-04 MED ORDER — KETOROLAC TROMETHAMINE 30 MG/ML IJ SOLN
INTRAMUSCULAR | Status: AC
  Filled 2014-10-04: qty 1

## 2014-10-04 MED ORDER — ONDANSETRON 4 MG PO TBDP
ORAL_TABLET | ORAL | Status: AC
  Filled 2014-10-04: qty 1

## 2014-10-04 NOTE — ED Provider Notes (Signed)
CSN: [id number]     Arrival date & time 10/04/14  0920 History   First MD Initiated Contact with Patient 10/04/14 1002     Chief Complaint  Patient presents with  . Migraine  . Facial Pain   (Consider location/radiation/quality/duration/timing/severity/associated sxs/prior Treatment) HPI Comments: 44 year old female with a history of headaches states that 2 days ago her sinuses feel stopped up and she developed a headache. Today she describes a migraine type headache that is primarily surrounding and behind the left eye. It is associated with photophobia and nausea without vomiting. She denies problems with vision, speech, hearing, swallowing, focal paresthesias or weakness. She states this is a typical headache for her. She usually goes to her doctor's office to receive an injection of Toradol.   Past Medical History  Diagnosis Date  . Depression   . GERD (gastroesophageal reflux disease)   . Hyperlipidemia   . Migraines   . Seasonal allergies    Past Surgical History  Procedure Laterality Date  . Tubal ligation     Family History  Problem Relation Age of Onset  . Diabetes      family hx  . Hyperlipidemia      family hx  . Stroke      family hx   History  Substance Use Topics  . Smoking status: Never Smoker   . Smokeless tobacco: Never Used  . Alcohol Use: No   OB History    No data available     Review of Systems  Constitutional: Positive for activity change and appetite change. Negative for fever and fatigue.  HENT: Positive for congestion.   Respiratory: Negative.   Cardiovascular: Negative.   Gastrointestinal: Negative.   Genitourinary: Negative.   Musculoskeletal: Negative.   Skin: Negative.   Neurological: Positive for headaches. Negative for dizziness, tremors, seizures, syncope, speech difficulty and weakness.    Allergies  Citalopram hydrobromide and Penicillins  Home Medications   Prior to Admission medications   Medication Sig Start Date End  Date Taking? Authorizing Provider  ALPRAZolam Prudy Feeler) 1 MG tablet TAKE 1 TABLET BY MOUTH 3 TIMES A DAY AS NEEDED 08/31/12  Yes Nelwyn Salisbury, MD  amLODipine (NORVASC) 5 MG tablet Take 1 tablet (5 mg total) by mouth daily. 02/26/14  Yes Nelwyn Salisbury, MD  buPROPion (WELLBUTRIN XL) 300 MG 24 hr tablet Take 1 tablet (300 mg total) by mouth daily. 06/19/14  Yes Nelwyn Salisbury, MD  levothyroxine (SYNTHROID, LEVOTHROID) 25 MCG tablet TAKE 1 TABLET (25 MCG TOTAL) BY MOUTH DAILY BEFORE BREAKFAST. 02/09/14  Yes Nelwyn Salisbury, MD  ondansetron (ZOFRAN) 4 MG tablet Take 1 tablet (4 mg total) by mouth every 6 (six) hours. 10/04/14   Hayden Rasmussen, NP  valACYclovir (VALTREX) 500 MG tablet Take 4 tablets (2,000 mg total) by mouth 2 (two) times daily. 06/14/14   Terressa Koyanagi, DO   BP 144/50 mmHg  Pulse 58  Temp(Src) 98.1 F (36.7 C) (Oral)  Resp 16  SpO2 100% Physical Exam  Constitutional: She is oriented to person, place, and time. She appears well-developed and well-nourished. No distress.  HENT:  Head: Normocephalic and atraumatic.  Mouth/Throat: Oropharynx is clear and moist. No oropharyngeal exudate.  Uvula midline. Soft palate rises symmetrically. Tongue moves equally left and right and remains midline. No intraoral swelling or lesions. Swallowing reflex is normal.  Eyes: Conjunctivae and EOM are normal. Pupils are equal, round, and reactive to light.  Neck: Normal range of motion. Neck supple.  Cardiovascular:  Normal rate, normal heart sounds and intact distal pulses.   Pulmonary/Chest: Effort normal and breath sounds normal. No respiratory distress. She has no wheezes. She has no rales.  Musculoskeletal: Normal range of motion. She exhibits no edema.  Lymphadenopathy:    She has no cervical adenopathy.  Neurological: She is alert and oriented to person, place, and time. No cranial nerve deficit. She exhibits normal muscle tone.  Finger to nose is normal.  Skin: Skin is warm and dry.  Psychiatric: She  has a normal mood and affect.  Nursing note and vitals reviewed.   ED Course  Procedures (including critical care time) Labs Review Labs Reviewed - No data to display  Imaging Review No results found.   MDM   1. Migraine without aura and without status migrainosus, not intractable    Toradol 60 mg IM and Zofran 4 mg by mouth Rx for Zofran 4 mg every 6-8 hours when necessary nausea. Continue your Imitrex as directed.    Hayden Rasmussenavid Kregg Cihlar, NP 10/04/14 1019

## 2014-10-04 NOTE — ED Notes (Signed)
C/o  Migraine headache with sensitivity to light.  Mild nausea.  Sinus pressure and pain.  On set Tuesday.  No relief with otc meds.

## 2014-10-04 NOTE — Discharge Instructions (Signed)
Migraine Headache °A migraine headache is very bad, throbbing pain on one or both sides of your head. Talk to your doctor about what things may bring on (trigger) your migraine headaches. °HOME CARE °· Only take medicines as told by your doctor. °· Lie down in a dark, quiet room when you have a migraine. °· Keep a journal to find out if certain things bring on migraine headaches. For example, write down: °¨ What you eat and drink. °¨ How much sleep you get. °¨ Any change to your diet or medicines. °· Lessen how much alcohol you drink. °· Quit smoking if you smoke. °· Get enough sleep. °· Lessen any stress in your life. °· Keep lights dim if bright lights bother you or make your migraines worse. °GET HELP RIGHT AWAY IF:  °· Your migraine becomes really bad. °· You have a fever. °· You have a stiff neck. °· You have trouble seeing. °· Your muscles are weak, or you lose muscle control. °· You lose your balance or have trouble walking. °· You feel like you will pass out (faint), or you pass out. °· You have really bad symptoms that are different than your first symptoms. °MAKE SURE YOU:  °· Understand these instructions. °· Will watch your condition. °· Will get help right away if you are not doing well or get worse. °Document Released: 03/31/2008 Document Revised: 09/14/2011 Document Reviewed: 02/27/2013 °ExitCare® Patient Information ©2015 [employer]. This information is not intended to replace advice given to you by your health care provider. Make sure you discuss any questions you have with your health care provider. ° °Recurrent Migraine Headache °A migraine headache is very bad, throbbing pain on one or both sides of your head. Recurrent migraines keep coming back. Talk to your doctor about what things may bring on (trigger) your migraine headaches. °HOME CARE °· Only take medicines as told by your doctor. °· Lie down in a dark, quiet room when you have a migraine. °· Keep a journal to find out if certain  things bring on migraine headaches. For example, write down: °¨ What you eat and drink. °¨ How much sleep you get. °¨ Any change to your diet or medicines. °· Lessen how much alcohol you drink. °· Quit smoking if you smoke. °· Get enough sleep. °· Lessen any stress in your life. °· Keep lights dim if bright lights bother you or make your migraines worse. °GET HELP IF: °· Medicine does not help your migraines. °· Your pain keeps coming back. °· You have a fever. °GET HELP RIGHT AWAY IF:  °· Your migraine becomes really bad. °· You have a stiff neck. °· You have trouble seeing. °· Your muscles are weak, or you lose muscle control. °· You lose your balance or have trouble walking. °· You feel like you will pass out (faint), or you pass out. °· You have really bad symptoms that are different than your first symptoms. °MAKE SURE YOU:  °· Understand these instructions. °· Will watch your condition. °· Will get help right away if you are not doing well or get worse. °Document Released: 03/31/2008 Document Revised: 06/27/2013 Document Reviewed: 02/27/2013 °ExitCare® Patient Information ©2015 [employer], LLC. This information is not intended to replace advice given to you by your health care provider. Make sure you discuss any questions you have with your health care provider. ° °

## 2014-11-01 ENCOUNTER — Telehealth: Payer: Self-pay | Admitting: Family Medicine

## 2014-11-01 MED ORDER — ALPRAZOLAM 1 MG PO TABS: 1.0000 mg | ORAL_TABLET | Freq: Three times a day (TID) | ORAL | Status: DC | PRN

## 2014-11-01 NOTE — Telephone Encounter (Signed)
I called in script 

## 2014-11-01 NOTE — Telephone Encounter (Signed)
Refill request for Alprazolam 1 mg take 1 po tid prn [facility] 

## 2014-11-01 NOTE — Telephone Encounter (Signed)
Call in #90 with 5 rf 

## 2014-12-04 ENCOUNTER — Encounter (HOSPITAL_COMMUNITY): Payer: Self-pay | Admitting: Emergency Medicine

## 2014-12-04 ENCOUNTER — Emergency Department (HOSPITAL_COMMUNITY)
Admission: EM | Admit: 2014-12-04 | Discharge: 2014-12-04 | Disposition: A | Discharge status: Home / Self Care | Payer: 59 | Attending: Emergency Medicine | Admitting: Emergency Medicine

## 2014-12-04 ENCOUNTER — Emergency Department (HOSPITAL_BASED_OUTPATIENT_CLINIC_OR_DEPARTMENT_OTHER)
Admit: 2014-12-04 | Discharge: 2014-12-04 | Disposition: A | Discharge status: Home / Self Care | Payer: 59 | Attending: Emergency Medicine | Admitting: Emergency Medicine

## 2014-12-04 DIAGNOSIS — Z8639 Personal history of other endocrine, nutritional and metabolic disease: Secondary | ICD-10-CM | POA: Diagnosis not present

## 2014-12-04 DIAGNOSIS — Z880 Allergy status to penicillin: Secondary | ICD-10-CM | POA: Diagnosis not present

## 2014-12-04 DIAGNOSIS — M62838 Other muscle spasm: Secondary | ICD-10-CM | POA: Diagnosis not present

## 2014-12-04 DIAGNOSIS — Z79899 Other long term (current) drug therapy: Secondary | ICD-10-CM | POA: Insufficient documentation

## 2014-12-04 DIAGNOSIS — M79604 Pain in right leg: Secondary | ICD-10-CM

## 2014-12-04 DIAGNOSIS — Z8719 Personal history of other diseases of the digestive system: Secondary | ICD-10-CM | POA: Insufficient documentation

## 2014-12-04 DIAGNOSIS — G43909 Migraine, unspecified, not intractable, without status migrainosus: Secondary | ICD-10-CM | POA: Insufficient documentation

## 2014-12-04 DIAGNOSIS — Z8709 Personal history of other diseases of the respiratory system: Secondary | ICD-10-CM | POA: Diagnosis not present

## 2014-12-04 DIAGNOSIS — F329 Major depressive disorder, single episode, unspecified: Secondary | ICD-10-CM | POA: Insufficient documentation

## 2014-12-04 DIAGNOSIS — M79609 Pain in unspecified limb: Secondary | ICD-10-CM | POA: Diagnosis not present

## 2014-12-04 MED ORDER — DIAZEPAM 5 MG PO TABS
5.0000 mg | ORAL_TABLET | Freq: Once | ORAL | Status: AC
  Administered 2014-12-04: 5 mg via ORAL
  Filled 2014-12-04: qty 1

## 2014-12-04 MED ORDER — DIAZEPAM 5 MG PO TABS: 5.0000 mg | ORAL_TABLET | Freq: Four times a day (QID) | ORAL | Status: DC | PRN

## 2014-12-04 MED ORDER — ACETAMINOPHEN 325 MG PO TABS
650.0000 mg | ORAL_TABLET | Freq: Once | ORAL | Status: AC
  Administered 2014-12-04: 650 mg via ORAL
  Filled 2014-12-04: qty 2

## 2014-12-04 NOTE — ED Provider Notes (Signed)
CSN: [id number]     Arrival date & time 12/04/14  1220 History   First MD Initiated Contact with Patient 12/04/14 1329     Chief Complaint  Patient presents with  . leg spasms      (Consider location/radiation/quality/duration/timing/severity/associated sxs/prior Treatment) HPI Comments: 44 yo female with lipids, anxiety, migraine HA presents with right leg pain and spasms worse posterior, swelling sensation.  No recent surgery, blood clot hx, injury, active ca or smoking.  Pain with palpation.  Pt stands for work.  Pt tried bananas and fluids.   The history is provided by the patient.    Past Medical History  Diagnosis Date  . Depression   . GERD (gastroesophageal reflux disease)   . Hyperlipidemia   . Migraines   . Seasonal allergies    Past Surgical History  Procedure Laterality Date  . Tubal ligation     Family History  Problem Relation Age of Onset  . Diabetes      family hx  . Hyperlipidemia      family hx  . Stroke      family hx   History  Substance Use Topics  . Smoking status: Never Smoker   . Smokeless tobacco: Never Used  . Alcohol Use: No   OB History    No data available     Review of Systems  Constitutional: Negative for fever and chills.  HENT: Negative for congestion.   Eyes: Negative for visual disturbance.  Respiratory: Negative for shortness of breath.   Cardiovascular: Positive for leg swelling. Negative for chest pain.  Gastrointestinal: Negative for vomiting and abdominal pain.  Genitourinary: Negative for dysuria and flank pain.  Musculoskeletal: Negative for back pain, neck pain and neck stiffness.  Skin: Negative for rash.  Neurological: Negative for weakness, light-headedness, numbness and headaches.      Allergies  Citalopram hydrobromide and Penicillins  Home Medications   Prior to Admission medications   Medication Sig Start Date End Date Taking? Authorizing Provider  ALPRAZolam Prudy Feeler) 1 MG tablet Take 1 tablet (1 mg  total) by mouth 3 (three) times daily as needed. 11/01/14   Nelwyn Salisbury, MD  amLODipine (NORVASC) 5 MG tablet Take 1 tablet (5 mg total) by mouth daily. 02/26/14   Nelwyn Salisbury, MD  buPROPion (WELLBUTRIN XL) 300 MG 24 hr tablet Take 1 tablet (300 mg total) by mouth daily. 06/19/14   Nelwyn Salisbury, MD  diazepam (VALIUM) 5 MG tablet Take 1 tablet (5 mg total) by mouth every 6 (six) hours as needed (spasms). 12/04/14   Blane Ohara, MD  levothyroxine (SYNTHROID, LEVOTHROID) 25 MCG tablet TAKE 1 TABLET (25 MCG TOTAL) BY MOUTH DAILY BEFORE BREAKFAST. 02/09/14   Nelwyn Salisbury, MD  ondansetron (ZOFRAN) 4 MG tablet Take 1 tablet (4 mg total) by mouth every 6 (six) hours. 10/04/14   Hayden Rasmussen, NP  valACYclovir (VALTREX) 500 MG tablet Take 4 tablets (2,000 mg total) by mouth 2 (two) times daily. 06/14/14   Terressa Koyanagi, DO   BP 152/96 mmHg  Pulse 68  Temp(Src) 98 F (36.7 C) (Oral)  Resp 18  SpO2 100%  LMP 11/22/2014 Physical Exam  Constitutional: She is oriented to person, place, and time. She appears well-developed and well-nourished.  HENT:  Head: Normocephalic and atraumatic.  Eyes: Right eye exhibits no discharge. Left eye exhibits no discharge.  Neck: Normal range of motion. Neck supple. No tracheal deviation present.  Cardiovascular: Normal rate.   Pulmonary/Chest: Effort normal.  Musculoskeletal: She exhibits tenderness. She exhibits no edema.  Mild tender posterior calf and thigh on right, 2+ distal pulse PT and DP, 5+ strength w f/e of rle   Neurological: She is alert and oriented to person, place, and time.  Skin: Skin is warm. No rash noted.  Psychiatric: She has a normal mood and affect.  Nursing note and vitals reviewed.   ED Course  Procedures (including critical care time) Labs Review Labs Reviewed - No data to display  Imaging Review No results found.   EKG Interpretation None      MDM   Final diagnoses:  Leg pain, right  Muscle spasm of right lower extremity    Concern for msk/ spasms however with new presentation for patient plan for US for blood clot. Ultrasound results reviewed no acute blood clot. Results and differential diagnosis were discussed with the patient/parent/guardian. Close follow up outpatient was discussed, comfortable with the plan.   Medications  diazepam (VALIUM) tablet 5 mg (5 mg Oral Given 12/04/14 1415)  acetaminophen (TYLENOL) tablet 650 mg (650 mg Oral Given 12/04/14 1414)    Filed Vitals:   12/04/14 1226 12/04/14 1458  BP: 187/98 152/96  Pulse: 78 68  Temp: 98.5 F (36.9 C) 98 F (36.7 C)  TempSrc: Oral Oral  Resp: 18 18  SpO2: 100% 100%    Final diagnoses:  Leg pain, right  Muscle spasm of right lower extremity        Blane OharaJoshua Liliya Fullenwider, MD 12/04/14 [phone]

## 2014-12-04 NOTE — ED Notes (Signed)
Per pt, states right lower extremity having spasms that started yesterday

## 2014-12-04 NOTE — Discharge Instructions (Signed)
If you were given medicines take as directed.  If you are on coumadin or contraceptives realize their levels and effectiveness is altered by many different medicines.  If you have any reaction (rash, tongues swelling, other) to the medicines stop taking and see a physician.    If your blood pressure was elevated in the ER make sure you follow up for management with a primary doctor or return for chest pain, shortness of breath or stroke symptoms.  Please follow up as directed and return to the ER or see a physician for new or worsening symptoms.  Thank you. Filed Vitals:   12/04/14 1226 12/04/14 1458  BP: 187/98 152/96  Pulse: 78 68  Temp: 98.5 F (36.9 C) 98 F (36.7 C)  TempSrc: Oral Oral  Resp: 18 18  SpO2: 100% 100%

## 2014-12-04 NOTE — Progress Notes (Signed)
*  PRELIMINARY RESULTS* Vascular Ultrasound Right lower extremity venous duplex has been completed.  Preliminary findings: negative for DVT  Farrel DemarkJill Eunice, RDMS, RVT  12/04/2014, 2:23 PM

## 2015-01-29 ENCOUNTER — Other Ambulatory Visit: Payer: Self-pay | Admitting: Family Medicine

## 2015-01-30 NOTE — Telephone Encounter (Signed)
Can we refill this? 

## 2015-02-01 ENCOUNTER — Other Ambulatory Visit: Payer: Self-pay | Admitting: Family Medicine

## 2015-02-01 NOTE — Telephone Encounter (Signed)
Refill Synthroid and Wellbutrin for one year, refill Xanax for 6 months

## 2015-02-01 NOTE — Telephone Encounter (Signed)
[facility] sent fax for patient to refill medications. [facility]. Last OV 02/26/2014

## 2015-02-01 NOTE — Telephone Encounter (Signed)
Please fill these. 

## 2015-02-04 MED ORDER — LEVOTHYROXINE SODIUM 25 MCG PO TABS: ORAL_TABLET | ORAL | Status: DC

## 2015-02-04 MED ORDER — BUPROPION HCL ER (XL) 300 MG PO TB24: 300.0000 mg | ORAL_TABLET | Freq: Every day | ORAL | Status: DC

## 2015-02-04 MED ORDER — ALPRAZOLAM 1 MG PO TABS: 1.0000 mg | ORAL_TABLET | Freq: Three times a day (TID) | ORAL | Status: DC | PRN

## 2015-02-04 NOTE — Telephone Encounter (Signed)
I already answered this, please take care of these meds

## 2015-03-13 ENCOUNTER — Other Ambulatory Visit: Payer: Self-pay | Admitting: Family Medicine

## 2015-05-21 ENCOUNTER — Ambulatory Visit (INDEPENDENT_AMBULATORY_CARE_PROVIDER_SITE_OTHER): Admitting: Family Medicine

## 2015-05-21 ENCOUNTER — Encounter: Payer: Self-pay | Admitting: Family Medicine

## 2015-05-21 VITALS — BP 138/86 | HR 74 | Temp 98.7°F | Ht 65.5 in | Wt 165.0 lb

## 2015-05-21 DIAGNOSIS — E039 Hypothyroidism, unspecified: Secondary | ICD-10-CM | POA: Diagnosis not present

## 2015-05-21 DIAGNOSIS — J019 Acute sinusitis, unspecified: Secondary | ICD-10-CM | POA: Diagnosis not present

## 2015-05-21 LAB — POCT URINALYSIS DIPSTICK
Bilirubin, UA: NEGATIVE
Glucose, UA: NEGATIVE
Ketones, UA: NEGATIVE
LEUKOCYTES UA: NEGATIVE
NITRITE UA: NEGATIVE
PH UA: 6.5
RBC UA: NEGATIVE
Spec Grav, UA: 1.025
UROBILINOGEN UA: 2

## 2015-05-21 LAB — TSH: TSH: 0.39 u[IU]/mL (ref 0.35–4.50)

## 2015-05-21 LAB — CBC WITH DIFFERENTIAL/PLATELET
BASOS ABS: 0 10*3/uL (ref 0.0–0.1)
Basophils Relative: 0.3 % (ref 0.0–3.0)
EOS ABS: 0.2 10*3/uL (ref 0.0–0.7)
Eosinophils Relative: 3.8 % (ref 0.0–5.0)
HEMATOCRIT: 35.8 % — AB (ref 36.0–46.0)
Hemoglobin: 11.7 g/dL — ABNORMAL LOW (ref 12.0–15.0)
Lymphocytes Relative: 33.8 % (ref 12.0–46.0)
Lymphs Abs: 1.7 10*3/uL (ref 0.7–4.0)
MCHC: 32.7 g/dL (ref 30.0–36.0)
MCV: 85.7 fl (ref 78.0–100.0)
MONO ABS: 0.4 10*3/uL (ref 0.1–1.0)
Monocytes Relative: 8.6 % (ref 3.0–12.0)
Neutro Abs: 2.7 10*3/uL (ref 1.4–7.7)
Neutrophils Relative %: 53.5 % (ref 43.0–77.0)
PLATELETS: 237 10*3/uL (ref 150.0–400.0)
RBC: 4.18 Mil/uL (ref 3.87–5.11)
RDW: 12.5 % (ref 11.5–15.5)
WBC: 5 10*3/uL (ref 4.0–10.5)

## 2015-05-21 LAB — BASIC METABOLIC PANEL
BUN: 10 mg/dL (ref 6–23)
CO2: 28 mEq/L (ref 19–32)
CREATININE: 0.79 mg/dL (ref 0.40–1.20)
Calcium: 9.3 mg/dL (ref 8.4–10.5)
Chloride: 105 mEq/L (ref 96–112)
GFR: 100.51 mL/min (ref >60.00)
Glucose, Bld: 113 mg/dL — ABNORMAL HIGH (ref 70–99)
Potassium: 4 mEq/L (ref 3.5–5.1)
SODIUM: 141 meq/L (ref 135–145)

## 2015-05-21 LAB — HEPATIC FUNCTION PANEL
ALBUMIN: 4.1 g/dL (ref 3.5–5.2)
ALT: 11 U/L (ref 0–35)
AST: 15 U/L (ref 0–37)
Alkaline Phosphatase: 76 U/L (ref 39–117)
Bilirubin, Direct: 0.1 mg/dL (ref 0.0–0.3)
Total Bilirubin: 0.6 mg/dL (ref 0.2–1.2)
Total Protein: 7.5 g/dL (ref 6.0–8.3)

## 2015-05-21 LAB — LIPID PANEL
Cholesterol: 233 mg/dL — ABNORMAL HIGH (ref 0–200)
HDL: 40.7 mg/dL (ref >39.00)
LDL CALC: 156 mg/dL — AB (ref 0–99)
NonHDL: 192.73
Total CHOL/HDL Ratio: 6
Triglycerides: 184 mg/dL — ABNORMAL HIGH (ref 0.0–149.0)
VLDL: 36.8 mg/dL (ref 0.0–40.0)

## 2015-05-21 MED ORDER — AZITHROMYCIN 250 MG PO TABS: ORAL_TABLET | ORAL | Status: DC

## 2015-05-21 NOTE — Progress Notes (Signed)
   Subjective:    Patient ID: Brenda Barrett, female    DOB: 03-04-1971, 44 y.o.   MRN: [id number]  HPI Here for one week of stuffy head, PND, and a ST. No cough or fever. Also she is worried about her thyroid gland and asks if she can talk to a specialist about this.    Review of Systems  Constitutional: Negative.   HENT: Positive for congestion, postnasal drip, sinus pressure and sore throat.   Eyes: Negative.   Respiratory: Negative.   Endocrine: Negative.        Objective:   Physical Exam  Constitutional: She appears well-developed and well-nourished.  HENT:  Right Ear: External ear normal.  Left Ear: External ear normal.  Nose: Nose normal.  Mouth/Throat: Oropharynx is clear and moist.  Eyes: Conjunctivae are normal.  Neck: No thyromegaly present.  Cardiovascular: Normal rate, regular rhythm, normal heart sounds and intact distal pulses.   Pulmonary/Chest: Effort normal and breath sounds normal.  Lymphadenopathy:    She has no cervical adenopathy.          Assessment & Plan:  Treat the sinusitis with a Zpack. Refer to Endocrinology.

## 2015-05-21 NOTE — Progress Notes (Signed)
Pre visit review using our clinic review tool, if applicable. No additional management support is needed unless otherwise documented below in the visit note. 

## 2015-06-12 ENCOUNTER — Ambulatory Visit: Payer: Self-pay | Admitting: Endocrinology

## 2015-06-13 ENCOUNTER — Telehealth: Payer: Self-pay | Admitting: Endocrinology

## 2015-06-13 NOTE — Telephone Encounter (Signed)
Patient no showed today's appt. Please advise on how to follow up. °A. No follow up necessary. °B. Follow up urgent. Contact patient immediately. °C. Follow up necessary. Contact patient and schedule visit in ___ days. °D. Follow up advised. Contact patient and schedule visit in ____weeks. ° °

## 2015-06-13 NOTE — Telephone Encounter (Signed)
No follow up necessary.  

## 2015-07-09 ENCOUNTER — Ambulatory Visit (INDEPENDENT_AMBULATORY_CARE_PROVIDER_SITE_OTHER): Admitting: Endocrinology

## 2015-07-09 DIAGNOSIS — Z0289 Encounter for other administrative examinations: Secondary | ICD-10-CM

## 2015-08-02 ENCOUNTER — Ambulatory Visit: Payer: Self-pay | Admitting: Family Medicine

## 2015-08-07 ENCOUNTER — Ambulatory Visit: Payer: Self-pay | Admitting: Family Medicine

## 2015-08-07 ENCOUNTER — Telehealth: Payer: Self-pay | Admitting: Family Medicine

## 2015-08-07 NOTE — Telephone Encounter (Signed)
Pt was on schedule to see Dr. Clent Ridges this morning and missed appointment. She wanted to reschedule for this afternoon and that is okay with doctor. Can you call pt to reschedule this?

## 2015-08-07 NOTE — Telephone Encounter (Signed)
Could not reach pt lmovm

## 2015-09-24 ENCOUNTER — Telehealth: Payer: Self-pay | Admitting: Family Medicine

## 2015-09-24 NOTE — Telephone Encounter (Signed)
Patient Name: Brenda SayresCHARLENE Dunne DOB: 1971-06-08 Initial Comment Caller states c/o chills, body aches, cough, headache, difficulty breathing Nurse Assessment Nurse: Roma KayserForsythe, RN, Santina Evansatherine Date/Time (Eastern Time): 09/24/2015 11:26:23 AM Confirm and document reason for call. If symptomatic, describe symptoms. You must click the next button to save text entered. ---caller states she started feeling bad yesterday, body aches, chills, cough and congestion. fever (tactile) Has the patient traveled out of the country within the last 30 days? ---Not Applicable Does the patient have any new or worsening symptoms? ---Yes Will a triage be completed? ---Yes Related visit to physician within the last 2 weeks? ---No Does the PT have any chronic conditions? (i.e. diabetes, asthma, etc.) ---Unknown Is the patient pregnant or possibly pregnant? (Ask all females between the ages of 3112-55) ---No Is this a behavioral health or substance abuse call? ---No Guidelines Guideline Title Affirmed Question Affirmed Notes Influenza - Seasonal Chest pain (Exception: MILD central chest pain, present only when coughing) Final Disposition User Go to ED Now Roma KayserForsythe, RN, Santina Evansatherine [facility] Disagree/Comply: Comply

## 2015-09-27 ENCOUNTER — Ambulatory Visit (INDEPENDENT_AMBULATORY_CARE_PROVIDER_SITE_OTHER): Admitting: Family Medicine

## 2015-09-27 ENCOUNTER — Encounter: Payer: Self-pay | Admitting: Family Medicine

## 2015-09-27 VITALS — BP 130/89 | HR 70 | Temp 98.0°F | Resp 20 | Wt 161.8 lb

## 2015-09-27 DIAGNOSIS — J0100 Acute maxillary sinusitis, unspecified: Secondary | ICD-10-CM

## 2015-09-27 MED ORDER — PREDNISONE 50 MG PO TABS: 50.0000 mg | ORAL_TABLET | Freq: Every day | ORAL | Status: DC

## 2015-09-27 MED ORDER — DOXYCYCLINE HYCLATE 100 MG PO TABS: 100.0000 mg | ORAL_TABLET | Freq: Two times a day (BID) | ORAL | Status: DC

## 2015-09-27 MED ORDER — BENZONATATE 200 MG PO CAPS: 200.0000 mg | ORAL_CAPSULE | Freq: Two times a day (BID) | ORAL | Status: DC | PRN

## 2015-09-27 NOTE — Patient Instructions (Signed)
Doxycyline prescribed prednisone 50 mg x5 days, with food Nasal saline, flonase, mucinex (could also try robitussin DM which has mucinex in it).  Tessalon perles for cough   Sinusitis, Adult Sinusitis is redness, soreness, and inflammation of the paranasal sinuses. Paranasal sinuses are air pockets within the bones of your face. They are located beneath your eyes, in the middle of your forehead, and above your eyes. In healthy paranasal sinuses, mucus is able to drain out, and air is able to circulate through them by way of your nose. However, when your paranasal sinuses are inflamed, mucus and air can become trapped. This can allow bacteria and other germs to grow and cause infection. Sinusitis can develop quickly and last only a short time (acute) or continue over a long period (chronic). Sinusitis that lasts for more than 12 weeks is considered chronic. CAUSES Causes of sinusitis include:  Allergies.  Structural abnormalities, such as displacement of the cartilage that separates your nostrils (deviated septum), which can decrease the air flow through your nose and sinuses and affect sinus drainage.  Functional abnormalities, such as when the small hairs (cilia) that line your sinuses and help remove mucus do not work properly or are not present. SIGNS AND SYMPTOMS Symptoms of acute and chronic sinusitis are the same. The primary symptoms are pain and pressure around the affected sinuses. Other symptoms include:  Upper toothache.  Earache.  Headache.  Bad breath.  Decreased sense of smell and taste.  A cough, which worsens when you are lying flat.  Fatigue.  Fever.  Thick drainage from your nose, which often is green and may contain pus (purulent).  Swelling and warmth over the affected sinuses. DIAGNOSIS Your health care provider will perform a physical exam. During your exam, your health care provider may perform any of the following to help determine if you have acute  sinusitis or chronic sinusitis:  Look in your nose for signs of abnormal growths in your nostrils (nasal polyps).  Tap over the affected sinus to check for signs of infection.  View the inside of your sinuses using an imaging device that has a light attached (endoscope). If your health care provider suspects that you have chronic sinusitis, one or more of the following tests may be recommended:  Allergy tests.  Nasal culture. A sample of mucus is taken from your nose, sent to a lab, and screened for bacteria.  Nasal cytology. A sample of mucus is taken from your nose and examined by your health care provider to determine if your sinusitis is related to an allergy. TREATMENT Most cases of acute sinusitis are related to a viral infection and will resolve on their own within 10 days. Sometimes, medicines are prescribed to help relieve symptoms of both acute and chronic sinusitis. These may include pain medicines, decongestants, nasal steroid sprays, or saline sprays. However, for sinusitis related to a bacterial infection, your health care provider will prescribe antibiotic medicines. These are medicines that will help kill the bacteria causing the infection. Rarely, sinusitis is caused by a fungal infection. In these cases, your health care provider will prescribe antifungal medicine. For some cases of chronic sinusitis, surgery is needed. Generally, these are cases in which sinusitis recurs more than 3 times per year, despite other treatments. HOME CARE INSTRUCTIONS  Drink plenty of water. Water helps thin the mucus so your sinuses can drain more easily.  Use a humidifier.  Inhale steam 3-4 times a day (for example, sit in the bathroom with the  shower running).  Apply a warm, moist washcloth to your face 3-4 times a day, or as directed by your health care provider.  Use saline nasal sprays to help moisten and clean your sinuses.  Take medicines only as directed by your health care  provider.  If you were prescribed either an antibiotic or antifungal medicine, finish it all even if you start to feel better. SEEK IMMEDIATE MEDICAL CARE IF:  You have increasing pain or severe headaches.  You have nausea, vomiting, or drowsiness.  You have swelling around your face.  You have vision problems.  You have a stiff neck.  You have difficulty breathing.   This information is not intended to replace advice given to you by your health care provider. Make sure you discuss any questions you have with your health care provider.   Document Released: 06/22/2005 Document Revised: 07/13/2014 Document Reviewed: 07/07/2011 Elsevier Interactive Patient Education [employer].

## 2015-09-27 NOTE — Progress Notes (Signed)
Patient ID: Brenda Barrett, female   DOB: 1971/04/10, 45 y.o.   MRN: [id number]    Brenda Barrett , 07/21/1970, 45 y.o., female MRN: [id number]  CC: Cough Subjective: Pt presents for an acute OV with complaints of cough of 5 days duration. Associated symptoms include body aches, fever, headache, nasal congestion, sore throat ,chills, ear pressure, gum line/teeth pain.  Pt feels symptoms are worse with laying down and better with nothing. Pt has tried honey/tea, flonase, cough syrups, advil, alk cold and sinus, mucinex,zyrtec, night/day cold and flu to ease their symptoms. Her husband was sick with similar symptoms, but his symptoms resolved, where her sats progressed. Declined flu this year, tdap UTD No asthma history.   Allergies  Allergen Reactions  . Citalopram Hydrobromide     REACTION: increased anxiety  . Penicillins     REACTION: unspecified   Social History  Substance Use Topics  . Smoking status: Never Smoker   . Smokeless tobacco: Never Used  . Alcohol Use: No   Past Medical History  Diagnosis Date  . Depression   . GERD (gastroesophageal reflux disease)   . Hyperlipidemia   . Migraines   . Seasonal allergies    Past Surgical History  Procedure Laterality Date  . Tubal ligation     Family History  Problem Relation Age of Onset  . Diabetes      family hx  . Hyperlipidemia      family hx  . Stroke      family hx     Medication List       This list is accurate as of: 09/27/15  3:14 PM.  Always use your most recent med list.               ALPRAZolam 1 MG tablet  Commonly known as:  XANAX  Take 1 tablet (1 mg total) by mouth 3 (three) times daily as needed.     amLODipine 5 MG tablet  Commonly known as:  NORVASC  TAKE 1 TABLET (5 MG TOTAL) BY MOUTH DAILY.     buPROPion 300 MG 24 hr tablet  Commonly known as:  WELLBUTRIN XL  Take 1 tablet (300 mg total) by mouth daily.     COMBIGAN 0.2-0.5 % ophthalmic solution  Generic drug:   brimonidine-timolol     levothyroxine 25 MCG tablet  Commonly known as:  SYNTHROID, LEVOTHROID  TAKE 1 TABLET (25 MCG TOTAL) BY MOUTH DAILY BEFORE BREAKFAST.     LUMIGAN OP  Apply to eye.       ROS: Negative, with the exception of above mentioned in HPI  Objective:  BP 130/89 mmHg  Pulse 70  Temp(Src) 98 F (36.7 C) (Oral)  Resp 20  Wt 161 lb 12 oz (73.369 kg)  SpO2 99%  LMP 09/16/2015 Body mass index is 26.5 kg/(m^2). Gen: Afebrile. No acute distress. Nontoxic in appearance. Well developed, well nourished, AAF, very pleasant. HENT: AT. . Bilateral TM visualized and normal in appearance. MMM, no oral lesions. Bilateral nares With severe bogginess/swelling and moderate erythema.. Throat without erythema or exudates. Postnasal drip present. No cough on exam. No hoarseness on exam. Tender to palpation bilateral maxillary sinuses. Eyes:Pupils Equal Round Reactive to light, Extraocular movements intact,  Conjunctiva without redness, discharge or icterus. Neck/lymp/endocrine: Supple, shotty anterior cervical lymphadenopathy CV: RRR Chest: CTAB, no wheeze or crackles. Good air movement, normal resp effort.  Abd: Soft.NTND. BS present Skin: No rashes, purpura or petechiae.  Neuro: Normal gait. PERLA. EOMi.  Alert. Oriented x3   Assessment/Plan: Brenda Barrett is a 45 y.o. female present for acute OV for  1. Acute maxillary sinusitis, recurrence not specified - Nasal saline, flonase, mucinex.  - doxycycline (VIBRA-TABS) 100 MG tablet; Take 1 tablet (100 mg total) by mouth 2 (two) times daily.  Dispense: 20 tablet; Refill: 0 - predniSONE (DELTASONE) 50 MG tablet; Take 1 tablet (50 mg total) by mouth daily with breakfast.  Dispense: 5 tablet; Refill: 0 - benzonatate (TESSALON) 200 MG capsule; Take 1 capsule (200 mg total) by mouth 2 (two) times daily as needed for cough.  Dispense: 20 capsule; Refill: 0 - Follow-up one week if no improvement, or worsening  symptoms.   electronically signed by:  Howard Pouch, DO  [place]

## 2015-10-11 ENCOUNTER — Encounter: Payer: Self-pay | Admitting: Family Medicine

## 2015-10-11 ENCOUNTER — Ambulatory Visit (INDEPENDENT_AMBULATORY_CARE_PROVIDER_SITE_OTHER): Admitting: Family Medicine

## 2015-10-11 VITALS — BP 131/75 | HR 59 | Temp 99.0°F | Ht 65.5 in | Wt 164.0 lb

## 2015-10-11 DIAGNOSIS — J0191 Acute recurrent sinusitis, unspecified: Secondary | ICD-10-CM

## 2015-10-11 MED ORDER — LEVOFLOXACIN 500 MG PO TABS: 500.0000 mg | ORAL_TABLET | Freq: Every day | ORAL | Status: AC

## 2015-10-11 NOTE — Progress Notes (Signed)
   Subjective:    Patient ID: Brenda Barrett, female    DOB: 07/08/1970, 45 y.o.   MRN: [id number]  HPI Here for residual sinus symptoms. She was seen recently for sinus pressure, PND, and headache and was diagnosed with a sinus infection. She took Doxycycline and felt a little better for a few days, however these symptoms have returned.    Review of Systems  Constitutional: Negative.   HENT: Positive for congestion, postnasal drip and sinus pressure. Negative for ear pain and sore throat.   Eyes: Negative.   Respiratory: Negative.        Objective:   Physical Exam  Constitutional: She appears well-developed and well-nourished.  HENT:  Right Ear: External ear normal.  Left Ear: External ear normal.  Nose: Nose normal.  Mouth/Throat: Oropharynx is clear and moist.  Eyes: Conjunctivae are normal.  Neck: No thyromegaly present.  Pulmonary/Chest: Effort normal and breath sounds normal.  Lymphadenopathy:    She has no cervical adenopathy.          Assessment & Plan:  Sinusitis, treat with Levaquin. Nelwyn SalisburyFRY,Aubrii Sharpless A, MD

## 2015-10-11 NOTE — Progress Notes (Signed)
Pre visit review using our clinic review tool, if applicable. No additional management support is needed unless otherwise documented below in the visit note. 

## 2015-12-15 ENCOUNTER — Other Ambulatory Visit: Payer: Self-pay | Admitting: Family Medicine

## 2015-12-16 NOTE — Telephone Encounter (Signed)
Last refill was on 02/04/2015 #90 + 3, last OV 10/11/2015. Ok to refill?

## 2016-03-14 ENCOUNTER — Other Ambulatory Visit: Payer: Self-pay | Admitting: Family Medicine

## 2016-05-27 ENCOUNTER — Ambulatory Visit (INDEPENDENT_AMBULATORY_CARE_PROVIDER_SITE_OTHER): Admitting: Adult Health

## 2016-05-27 ENCOUNTER — Encounter: Payer: Self-pay | Admitting: Adult Health

## 2016-05-27 VITALS — BP 126/70 | Temp 98.7°F | Ht 65.5 in | Wt 161.2 lb

## 2016-05-27 DIAGNOSIS — J0100 Acute maxillary sinusitis, unspecified: Secondary | ICD-10-CM | POA: Diagnosis not present

## 2016-05-27 MED ORDER — DOXYCYCLINE HYCLATE 100 MG PO CAPS: 100.0000 mg | ORAL_CAPSULE | Freq: Two times a day (BID) | ORAL | 0 refills | Status: DC

## 2016-05-27 NOTE — Patient Instructions (Signed)
It was great meeting you today  Your exam is consistent with a sinuses infection.   I have prescribed an antibiotic called Doxycycline. Take this as directed  Follow up if no improvement in the next 2-3 days    General Recommendations:    Please drink plenty of fluids.  Get plenty of rest   Sleep in humidified air  Use saline nasal sprays  Netti pot   OTC Medications:  Decongestants - helps relieve congestion   Flonase (generic fluticasone) or Nasacort (generic triamcinolone) - please make sure to use the "cross-over" technique at a 45 degree angle towards the opposite eye as opposed to straight up the nasal passageway.   Sudafed (generic pseudoephedrine - Note this is the one that is available behind the pharmacy counter); Products with phenylephrine (-PE) may also be used but is often not as effective as pseudoephedrine.   If you have HIGH BLOOD PRESSURE - Coricidin HBP; AVOID any product that is -D as this contains pseudoephedrine which may increase your blood pressure.  Afrin (oxymetazoline) every 6-8 hours for up to 3 days.   Allergies - helps relieve runny nose, itchy eyes and sneezing   Claritin (generic loratidine), Allegra (fexofenidine), or Zyrtec (generic cyrterizine) for runny nose. These medications should not cause drowsiness.  Note - Benadryl (generic diphenhydramine) may be used however may cause drowsiness  Cough -   Delsym or Robitussin (generic dextromethorphan)  Expectorants - helps loosen mucus to ease removal   Mucinex (generic guaifenesin) as directed on the package.  Headaches / General Aches   Tylenol (generic acetaminophen) - DO NOT EXCEED 3 grams (3,000 mg) in a 24 hour time period  Advil/Motrin (generic ibuprofen)   Sore Throat -   Salt water gargle   Chloraseptic (generic benzocaine) spray or lozenges / Sucrets (generic dyclonine)    Sinusitis Sinusitis is redness, soreness, and inflammation of the paranasal sinuses.  Paranasal sinuses are air pockets within the bones of your face (beneath the eyes, the middle of the forehead, or above the eyes). In healthy paranasal sinuses, mucus is able to drain out, and air is able to circulate through them by way of your nose. However, when your paranasal sinuses are inflamed, mucus and air can become trapped. This can allow bacteria and other germs to grow and cause infection. Sinusitis can develop quickly and last only a short time (acute) or continue over a long period (chronic). Sinusitis that lasts for more than 12 weeks is considered chronic.  CAUSES  Causes of sinusitis include:  Allergies.  Structural abnormalities, such as displacement of the cartilage that separates your nostrils (deviated septum), which can decrease the air flow through your nose and sinuses and affect sinus drainage.  Functional abnormalities, such as when the small hairs (cilia) that line your sinuses and help remove mucus do not work properly or are not present. SIGNS AND SYMPTOMS  Symptoms of acute and chronic sinusitis are the same. The primary symptoms are pain and pressure around the affected sinuses. Other symptoms include:  Upper toothache.  Earache.  Headache.  Bad breath.  Decreased sense of smell and taste.  A cough, which worsens when you are lying flat.  Fatigue.  Fever.  Thick drainage from your nose, which often is green and may contain pus (purulent).  Swelling and warmth over the affected sinuses. DIAGNOSIS  Your health care provider will perform a physical exam. During the exam, your health care provider may:  Look in your nose for signs of  abnormal growths in your nostrils (nasal polyps).  Tap over the affected sinus to check for signs of infection.  View the inside of your sinuses (endoscopy) using an imaging device that has a light attached (endoscope). If your health care provider suspects that you have chronic sinusitis, one or more of the following  tests may be recommended:  Allergy tests.  Nasal culture. A sample of mucus is taken from your nose, sent to a lab, and screened for bacteria.  Nasal cytology. A sample of mucus is taken from your nose and examined by your health care provider to determine if your sinusitis is related to an allergy. TREATMENT  Most cases of acute sinusitis are related to a viral infection and will resolve on their own within 10 days. Sometimes medicines are prescribed to help relieve symptoms (pain medicine, decongestants, nasal steroid sprays, or saline sprays).  However, for sinusitis related to a bacterial infection, your health care provider will prescribe antibiotic medicines. These are medicines that will help kill the bacteria causing the infection.  Rarely, sinusitis is caused by a fungal infection. In theses cases, your health care provider will prescribe antifungal medicine. For some cases of chronic sinusitis, surgery is needed. Generally, these are cases in which sinusitis recurs more than 3 times per year, despite other treatments. HOME CARE INSTRUCTIONS   Drink plenty of water. Water helps thin the mucus so your sinuses can drain more easily.  Use a humidifier.  Inhale steam 3 to 4 times a day (for example, sit in the bathroom with the shower running).  Apply a warm, moist washcloth to your face 3 to 4 times a day, or as directed by your health care provider.  Use saline nasal sprays to help moisten and clean your sinuses.  Take medicines only as directed by your health care provider.  If you were prescribed either an antibiotic or antifungal medicine, finish it all even if you start to feel better. SEEK IMMEDIATE MEDICAL CARE IF:  You have increasing pain or severe headaches.  You have nausea, vomiting, or drowsiness.  You have swelling around your face.  You have vision problems.  You have a stiff neck.  You have difficulty breathing. MAKE SURE YOU:   Understand these  instructions.  Will watch your condition.  Will get help right away if you are not doing well or get worse. Document Released: 06/22/2005 Document Revised: 11/06/2013 Document Reviewed: 07/07/2011 [facility] Patient Information 2015 [employer] [place]. This information is not intended to replace advice given to you by your health care provider. Make sure you discuss any questions you have with your health care provider.

## 2016-05-27 NOTE — Progress Notes (Addendum)
Subjective:    Patient ID: Brenda Barrett, female    DOB: 04-Nov-1970, 45 y.o.   MRN: [id number]  Sinusitis  This is a new problem. The current episode started 1 to 4 weeks ago (10 days ). The problem has been gradually worsening since onset. There has been no fever. The pain is mild. Associated symptoms include congestion, coughing (productive ), ear pain, headaches, sinus pressure and a sore throat. Pertinent negatives include no chills. Past treatments include spray decongestants and oral decongestants (Flonase and Robatussin). The treatment provided no relief.      Review of Systems  Constitutional: Positive for activity change and fatigue. Negative for chills, fever and unexpected weight change.  HENT: Positive for congestion, ear pain, postnasal drip, rhinorrhea, sinus pain, sinus pressure and sore throat. Negative for ear discharge, tinnitus, trouble swallowing and voice change.   Respiratory: Positive for cough (productive ).   Cardiovascular: Negative.   Gastrointestinal: Negative.   Musculoskeletal: Negative.   Neurological: Positive for dizziness and headaches.  All other systems reviewed and are negative.  Past Medical History:  Diagnosis Date  . Depression   . GERD (gastroesophageal reflux disease)   . Hyperlipidemia   . Migraines   . Seasonal allergies     Social History   Social History  . Marital status: Divorced    Spouse name: N/A  . Number of children: N/A  . Years of education: N/A   Occupational History  . Not on file.   Social History Main Topics  . Smoking status: Never Smoker  . Smokeless tobacco: Never Used  . Alcohol use No  . Drug use: No  . Sexual activity: Yes    Birth control/ protection: Condom   Other Topics Concern  . Not on file   Social History Narrative  . No narrative on file    Past Surgical History:  Procedure Laterality Date  . TUBAL LIGATION      Family History  Problem Relation Age of Onset  . Diabetes     family hx  . Hyperlipidemia      family hx  . Stroke      family hx    Allergies  Allergen Reactions  . Citalopram Hydrobromide     REACTION: increased anxiety  . Penicillins     REACTION: unspecified    Current Outpatient Prescriptions on File Prior to Visit  Medication Sig Dispense Refill  . ALPRAZolam (XANAX) 1 MG tablet Take 1 tablet (1 mg total) by mouth 3 (three) times daily as needed. 270 tablet 1  . amLODipine (NORVASC) 5 MG tablet TAKE 1 TABLET (5 MG TOTAL) BY MOUTH DAILY. 30 tablet 4  . benzonatate (TESSALON) 200 MG capsule Take 1 capsule (200 mg total) by mouth 2 (two) times daily as needed for cough. 20 capsule 0  . Bimatoprost (LUMIGAN OP) Apply to eye.    Marland Kitchen. buPROPion (WELLBUTRIN XL) 300 MG 24 hr tablet TAKE 1 TABLET DAILY 90 tablet 3  . COMBIGAN 0.2-0.5 % ophthalmic solution     . levothyroxine (SYNTHROID, LEVOTHROID) 25 MCG tablet TAKE 1 TABLET DAILY BEFORE BREAKFAST 90 tablet 1   No current facility-administered medications on file prior to visit.     BP 126/70   Temp 98.7 F (37.1 C) (Oral)   Ht 5' 5.5" (1.664 m)   Wt 161 lb 3.2 oz (73.1 kg)   BMI 26.42 kg/m       Objective:   Physical Exam  Constitutional: She is oriented  to person, place, and time. She appears well-developed and well-nourished. No distress.  HENT:  Head: Normocephalic and atraumatic.  Right Ear: Hearing, tympanic membrane, external ear and ear canal normal. Tympanic membrane is not erythematous and not bulging.  Left Ear: Hearing, tympanic membrane, external ear and ear canal normal. Tympanic membrane is not erythematous and not bulging.  Nose: Mucosal edema and rhinorrhea present. Right sinus exhibits maxillary sinus tenderness. Right sinus exhibits no frontal sinus tenderness. Left sinus exhibits maxillary sinus tenderness. Left sinus exhibits no frontal sinus tenderness.  Mouth/Throat: Uvula is midline and mucous membranes are normal. Oropharyngeal exudate present. No posterior  oropharyngeal edema, posterior oropharyngeal erythema or tonsillar abscesses.  Eyes: Conjunctivae and EOM are normal. Pupils are equal, round, and reactive to light. Right eye exhibits no discharge. Left eye exhibits no discharge. No scleral icterus.  Neck: Normal range of motion. Neck supple.  Cardiovascular: Normal rate, regular rhythm, normal heart sounds and intact distal pulses.  Exam reveals no gallop and no friction rub.   No murmur heard. Pulmonary/Chest: Effort normal and breath sounds normal. No respiratory distress. She has no wheezes. She has no rales. She exhibits no tenderness.  Lymphadenopathy:       Head (right side): Submandibular adenopathy present.       Head (left side): Submandibular adenopathy present.    She has cervical adenopathy.  Neurological: She is alert and oriented to person, place, and time.  Skin: Skin is warm and dry. No rash noted. She is not diaphoretic. No erythema. No pallor.  Psychiatric: She has a normal mood and affect. Her behavior is normal. Judgment and thought content normal.  Nursing note and vitals reviewed.     Assessment & Plan:  1. Subacute maxillary sinusitis - doxycycline (VIBRAMYCIN) 100 MG capsule; Take 1 capsule (100 mg total) by mouth 2 (two) times daily.  Dispense: 14 capsule; Refill: 0 - Continue with flonase  - Stay hydrated and rest - Follow up if no improvement in the next 2-3 days   Shirline Freesory Natarsha Hurwitz, NP

## 2016-07-09 ENCOUNTER — Ambulatory Visit: Payer: Self-pay | Admitting: Adult Health

## 2016-12-08 LAB — BASIC METABOLIC PANEL
BUN: 7 (ref 4–21)
Creatinine: 0.9 (ref 0.5–1.1)
GLUCOSE: 94
Potassium: 4.5 (ref 3.4–5.3)
SODIUM: 139 (ref 137–147)

## 2016-12-08 LAB — LIPID PANEL
Cholesterol: 253 — AB (ref 0–200)
HDL: 46 (ref 35–70)
LDL CALC: 169
Triglycerides: 189 — AB (ref 40–160)

## 2016-12-08 LAB — HEPATIC FUNCTION PANEL
ALK PHOS: 82 (ref 25–125)
ALT: 15 (ref 7–35)
AST: 19 (ref 13–35)
BILIRUBIN, TOTAL: 0.4

## 2016-12-08 LAB — CBC AND DIFFERENTIAL
HEMATOCRIT: 34 — AB (ref 36–46)
Hemoglobin: 11.1 — AB (ref 12.0–16.0)
PLATELETS: 229 (ref 150–399)
WBC: 4.4

## 2016-12-08 LAB — TSH: TSH: 0.69 (ref 0.41–5.90)

## 2016-12-08 LAB — HEMOGLOBIN A1C: Hemoglobin A1C: 5.6

## 2016-12-08 LAB — VITAMIN D 25 HYDROXY (VIT D DEFICIENCY, FRACTURES): Vit D, 25-Hydroxy: 24.1

## 2016-12-17 ENCOUNTER — Ambulatory Visit (INDEPENDENT_AMBULATORY_CARE_PROVIDER_SITE_OTHER): Admitting: Family Medicine

## 2016-12-17 ENCOUNTER — Encounter: Payer: Self-pay | Admitting: Family Medicine

## 2016-12-17 VITALS — BP 159/99 | HR 79 | Temp 98.8°F | Ht 65.5 in | Wt 163.0 lb

## 2016-12-17 DIAGNOSIS — R5383 Other fatigue: Secondary | ICD-10-CM | POA: Diagnosis not present

## 2016-12-17 NOTE — Progress Notes (Signed)
   Subjective:    Patient ID: Brenda Barrett, female    DOB: [birth year], 46 y.o.   MRN: [id number]  HPI Here to discuss one month of generalized weakness and fatigue. No other specific symptoms. She saw Dr. Cherly Hensenousins, her GYN, last week and she ordered extensive lab work. This returned showing a low vitamin D level, so she was started on 50,000 unit capsules once a week. So far she has only taken one dose.    Review of Systems  Constitutional: Positive for fatigue. Negative for fever and unexpected weight change.  Respiratory: Negative.   Cardiovascular: Negative.   Gastrointestinal: Negative.   Musculoskeletal: Positive for myalgias. Negative for arthralgias and joint swelling.  Neurological: Positive for weakness.       Objective:   Physical Exam  Constitutional: She is oriented to person, place, and time. She appears well-developed and well-nourished.  Neck: No thyromegaly present.  Cardiovascular: Normal rate, regular rhythm, normal heart sounds and intact distal pulses.   Pulmonary/Chest: Effort normal and breath sounds normal. No respiratory distress. She has no wheezes. She has no rales.  Abdominal: Soft. Bowel sounds are normal. She exhibits no distension and no mass. There is no tenderness. There is no rebound and no guarding.  Lymphadenopathy:    She has no cervical adenopathy.  Neurological: She is alert and oriented to person, place, and time.          Assessment & Plan:  Weakness and fatigue of uncertain etiology. We will try to have the results of her recent test results sent over to us to review. Based on these we may order more testing. She will stay on the vitamin D regimen for now.  Gershon CraneStephen Fry, MD

## 2016-12-17 NOTE — Patient Instructions (Addendum)
WE NOW OFFER   [facility] FAST TRACK!!!  SAME DAY Appointments for ACUTE CARE  Such as: Sprains, Injuries, cuts, abrasions, rashes, muscle pain, joint pain, back pain Colds, flu, sore throats, headache, allergies, cough, fever  Ear pain, sinus and eye infections Abdominal pain, nausea, vomiting, diarrhea, upset stomach Animal/insect bites  3 Easy Ways to Schedule: Walk-In Scheduling Call in scheduling Mychart Sign-up: [email or web address]         

## 2016-12-18 ENCOUNTER — Encounter: Payer: Self-pay | Admitting: Family Medicine

## 2017-01-14 ENCOUNTER — Encounter (HOSPITAL_COMMUNITY): Payer: Self-pay

## 2017-01-14 ENCOUNTER — Ambulatory Visit (HOSPITAL_COMMUNITY)
Admission: EM | Admit: 2017-01-14 | Discharge: 2017-01-14 | Disposition: A | Discharge status: Home / Self Care | Attending: Emergency Medicine | Admitting: Emergency Medicine

## 2017-01-14 ENCOUNTER — Emergency Department (HOSPITAL_COMMUNITY)
Admission: EM | Admit: 2017-01-14 | Discharge: 2017-01-14 | Disposition: A | Discharge status: Home / Self Care | Attending: Emergency Medicine | Admitting: Emergency Medicine

## 2017-01-14 ENCOUNTER — Other Ambulatory Visit: Payer: Self-pay

## 2017-01-14 ENCOUNTER — Emergency Department (HOSPITAL_COMMUNITY)

## 2017-01-14 ENCOUNTER — Encounter (HOSPITAL_COMMUNITY): Payer: Self-pay | Admitting: Emergency Medicine

## 2017-01-14 DIAGNOSIS — I10 Essential (primary) hypertension: Secondary | ICD-10-CM | POA: Insufficient documentation

## 2017-01-14 DIAGNOSIS — E039 Hypothyroidism, unspecified: Secondary | ICD-10-CM | POA: Diagnosis not present

## 2017-01-14 DIAGNOSIS — R5383 Other fatigue: Secondary | ICD-10-CM | POA: Insufficient documentation

## 2017-01-14 DIAGNOSIS — E860 Dehydration: Secondary | ICD-10-CM

## 2017-01-14 DIAGNOSIS — R531 Weakness: Secondary | ICD-10-CM

## 2017-01-14 DIAGNOSIS — I169 Hypertensive crisis, unspecified: Secondary | ICD-10-CM

## 2017-01-14 DIAGNOSIS — Z79899 Other long term (current) drug therapy: Secondary | ICD-10-CM | POA: Diagnosis not present

## 2017-01-14 LAB — CBC
HCT: 35.5 % — ABNORMAL LOW (ref 36.0–46.0)
HEMOGLOBIN: 11.4 g/dL — AB (ref 12.0–15.0)
MCH: 28.1 pg (ref 26.0–34.0)
MCHC: 32.1 g/dL (ref 30.0–36.0)
MCV: 87.4 fL (ref 78.0–100.0)
PLATELETS: 284 10*3/uL (ref 150–400)
RBC: 4.06 MIL/uL (ref 3.87–5.11)
RDW: 12.9 % (ref 11.5–15.5)
WBC: 5.2 10*3/uL (ref 4.0–10.5)

## 2017-01-14 LAB — BASIC METABOLIC PANEL
ANION GAP: 6 (ref 5–15)
BUN: 7 mg/dL (ref 6–20)
CALCIUM: 9 mg/dL (ref 8.9–10.3)
CO2: 23 mmol/L (ref 22–32)
Chloride: 105 mmol/L (ref 101–111)
Creatinine, Ser: 0.94 mg/dL (ref 0.44–1.00)
GFR calc non Af Amer: >60 mL/min (ref >60)
Glucose, Bld: 107 mg/dL — ABNORMAL HIGH (ref 65–99)
Potassium: 4.4 mmol/L (ref 3.5–5.1)
SODIUM: 134 mmol/L — AB (ref 135–145)

## 2017-01-14 LAB — I-STAT TROPONIN, ED: TROPONIN I, POC: 0 ng/mL (ref 0.00–0.08)

## 2017-01-14 MED ORDER — AMLODIPINE BESYLATE 5 MG PO TABS: 5.0000 mg | ORAL_TABLET | Freq: Every day | ORAL | 0 refills | Status: DC

## 2017-01-14 MED ORDER — AMLODIPINE BESYLATE 5 MG PO TABS
5.0000 mg | ORAL_TABLET | Freq: Once | ORAL | Status: AC
  Administered 2017-01-14: 5 mg via ORAL
  Filled 2017-01-14: qty 1

## 2017-01-14 NOTE — ED Notes (Signed)
Pt ambulatory to room with independent steady gait, declined wheelchair. Pt changed into gown. Pt connected to pulse ox, BP cuff and 5 lead cardiac monitor.

## 2017-01-14 NOTE — ED Notes (Signed)
pts BP 190/111. Dondra SpryGail informed and states she is ok if patient goes home.
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
## 2017-01-14 NOTE — Discharge Instructions (Signed)
Go to the ER for further evaluation of your condition.
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
## 2017-01-14 NOTE — ED Provider Notes (Signed)
[facility]-EMERGENCY DEPT Provider Note   CSN: [id number] Arrival date & time: 01/14/17  1728     History   Chief Complaint Chief Complaint  Patient presents with  . Hypertension    HPI Brenda Barrett is a 46 y.o. female.  Sit 46 year old female who presents to the emergency department with high blood pressure. She was seen by her primary care physician approximately one month ago on June 3 for weakness and fatigue that have been going on for approximately 5 weeks, reviewed the chart and it was noted at that time that her blood pressure was 159/99, it was not addressed at that time. Patient has continued to have fatigue.  She reports that she has stress at her place of employment and is considering leaving. Today at urgent care.  She reports that she had left shoulder and arm pain as well.  They center to the emergency department for further evaluation of the diagnosis of hypertensive urgency.  Blood pressure at urgent care was 203/109. Patient denies any blurry vision, headache, chest pain, shortness of breath.      Past Medical History:  Diagnosis Date  . Depression   . GERD (gastroesophageal reflux disease)   . Hyperlipidemia   . Migraines   . Seasonal allergies     Patient Active Problem List   Diagnosis Date Noted  . Maxillary sinusitis, acute 09/27/2015  . HTN (hypertension) 01/23/2014  . Hypothyroidism 08/28/2013  . ANEMIA 07/10/2010  . SINUSITIS, ACUTE 07/10/2010  . UNSPECIFIED DISORER NERVOUS SYSTEM&SENSE ORGANS 07/10/2010  . ANXIETY STATE, UNSPECIFIED 12/31/2009  . 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 LACERATION 11/01/2009  . TINEA PEDIS 10/02/2008  . TENDINITIS 02/01/2008  . ALLERGIC RHINITIS 12/20/2007  . HYPERLIPIDEMIA 07/21/2007  . GERD 07/21/2007  . DEPRESSION 05/27/2007  . MIGRAINE HEADACHE 03/14/2007    Past Surgical History:  Procedure Laterality Date  . TUBAL LIGATION      OB History    No data available       Home Medications     Prior to Admission medications   Medication Sig Start Date End Date Taking? Authorizing Provider  aspirin-acetaminophen-caffeine (EXCEDRIN MIGRAINE) [phone] MG tablet Take 1 tablet by mouth every 6 (six) hours as needed for headache.   Yes [provider]  Bimatoprost (LUMIGAN OP) Place 1 drop into both eyes daily.    Yes [provider]  buPROPion (WELLBUTRIN XL) 300 MG 24 hr tablet TAKE 1 TABLET DAILY 12/17/15  Yes Nelwyn Salisbury, MD  COMBIGAN 0.2-0.5 % ophthalmic solution Place 1 drop into both eyes daily.  09/16/15  Yes [provider]  levothyroxine (SYNTHROID, LEVOTHROID) 25 MCG tablet TAKE 1 TABLET DAILY BEFORE BREAKFAST Patient taking differently: TAKE 1 TABLET DAILY AT BEDTIME 03/17/16  Yes Nelwyn Salisbury, MD  Vitamin D, Ergocalciferol, (DRISDOL) 50000 units CAPS capsule Take 50,000 Units by mouth every 7 (seven) days. MONDAYS   Yes [provider]  ALPRAZolam (XANAX) 1 MG tablet Take 1 tablet (1 mg total) by mouth 3 (three) times daily as needed. Patient not taking: Reported on 01/14/2017 02/04/15   Nelwyn Salisbury, MD  amLODipine (NORVASC) 5 MG tablet TAKE 1 TABLET (5 MG TOTAL) BY MOUTH DAILY. Patient not taking: Reported on 01/14/2017 03/13/15   Nelwyn Salisbury, MD  amLODipine (NORVASC) 5 MG tablet Take 1 tablet (5 mg total) by mouth daily. 01/14/17   Earley Favor, NP    Family History Family History  Problem Relation Age of Onset  . Diabetes Unknown  family hx  . Hyperlipidemia Unknown        family hx  . Stroke Unknown        family hx    Social History Social History  Substance Use Topics  . Smoking status: Never Smoker  . Smokeless tobacco: Never Used  . Alcohol use No     Allergies   Penicillins and Citalopram hydrobromide   Review of Systems Review of Systems  Constitutional: Positive for fatigue.  Respiratory: Negative for shortness of breath.   Cardiovascular: Negative for chest pain.  Gastrointestinal: Negative for  abdominal pain and nausea.  Musculoskeletal: Positive for myalgias.  Neurological: Negative for dizziness, weakness and headaches.  Psychiatric/Behavioral: The patient is nervous/anxious.   All other systems reviewed and are negative.    Physical Exam Updated Vital Signs BP (!) 190/111   Pulse 82   Temp 98.2 F (36.8 C) (Oral)   Resp 13   Ht 5' 6.5" (1.689 m)   Wt 74.8 kg (165 lb)   LMP 01/11/2017   SpO2 100%   BMI 26.23 kg/m   Physical Exam  Constitutional: She appears well-developed and well-nourished.  HENT:  Head: Normocephalic.  Eyes: Pupils are equal, round, and reactive to light.  Neck: Normal range of motion.  Cardiovascular: Normal rate and regular rhythm.   Pulmonary/Chest: Effort normal.  Musculoskeletal: Normal range of motion.  Neurological: She is alert.  Skin: Skin is warm and dry. No rash noted.  Psychiatric: She has a normal mood and affect.  Nursing note and vitals reviewed.    ED Treatments / Results  Labs (all labs ordered are listed, but only abnormal results are displayed) Labs Reviewed  BASIC METABOLIC PANEL - Abnormal; Notable for the following:       Result Value   Sodium 134 (*)    Glucose, Bld 107 (*)    All other components within normal limits  CBC - Abnormal; Notable for the following:    Hemoglobin 11.4 (*)    HCT 35.5 (*)    All other components within normal limits  I-STAT TROPOININ, ED    EKG  EKG Interpretation None       Radiology Dg Chest 2 View  Result Date: 01/14/2017 CLINICAL DATA:  Right arm pain for 1 day.  Hypertension. EXAM: CHEST  2 VIEW COMPARISON:  07/11/2007 FINDINGS: The heart size and mediastinal contours are within normal limits. Both lungs are clear. The visualized skeletal structures are unremarkable. IMPRESSION: No active cardiopulmonary disease. Electronically Signed   By: Myles RosenthalJohn  Stahl M.D.   On: 01/14/2017 18:36    Procedures Procedures (including critical care time)  Medications Ordered in  ED Medications  amLODipine (NORVASC) tablet 5 mg (5 mg Oral Given 01/14/17 2035)     Initial Impression / Assessment and Plan / ED Course  I have reviewed the triage vital signs and the nursing notes.  Pertinent labs & imaging results that were available during my care of the patient were reviewed by me and considered in my medical decision making (see chart for details).      Review of labs and vital signs  Will start Norvasc 5 mg due to recent findings of 2 elevated Blood Pressures about 5 weeks apart in different venues   Final Clinical Impressions(s) / ED Diagnoses   Final diagnoses:  Hypertension, unspecified type    New Prescriptions Discharge Medication List as of 01/14/2017  9:45 PM    START taking these medications   Details  !! amLODipine (  NORVASC) 5 MG tablet Take 1 tablet (5 mg total) by mouth daily., Starting Thu 01/14/2017, Print     !! - Potential duplicate medications found. Please discuss with provider.       Earley Favor, NP 01/15/17 0036    Doug Sou, MD 01/17/17 [phone]

## 2017-01-14 NOTE — ED Notes (Signed)
Pt ambulatory to and from restroom with independent steady gait. 

## 2017-01-14 NOTE — ED Triage Notes (Signed)
Pt reports not feeling well for the past couple days. [facility] and noticed to have HTN with no hx of the same. Reports right arm pain and headache. 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

## 2017-01-14 NOTE — ED Notes (Signed)
Pt A&OX4, ambulatory at d/c with independent steady gait, NAD. Pt verbalized understanding of d/c instructions, prescription and follow up care. 

## 2017-01-14 NOTE — Discharge Instructions (Signed)
As discussed check your blood pressure in a couple days  Make an appointment with your PCP for reevaluation in 1 week or so  Take your blood pressure medication at night

## 2017-01-14 NOTE — ED Provider Notes (Signed)
CSN: [id number]     Arrival date & time 01/14/17  1601 History   First MD Initiated Contact with Patient 01/14/17 1651     Chief Complaint  Patient presents with  . Arm Pain   (Consider location/radiation/quality/duration/timing/severity/associated sxs/prior Treatment) 46 year old female presents to clinic with a chief complaint of weakness, right arm pain and tingling, headache, and dehydration. She works for the [employer], air conditioning in her facility has been down, states she had one bottle of water today, last period of urination was this morning before work. She is also hypertensive in the clinic, last time she took her blood pressure medicine was one week ago. Has no past history of stroke, heart attack, or diabetes, however all of these conditions run in her family. She is currently complaining of headache, weakness, blurred vision, and has had intermittent chest pain as well. She does not smoke or drink.   The history is provided by the patient.    Past Medical History:  Diagnosis Date  . Depression   . GERD (gastroesophageal reflux disease)   . Hyperlipidemia   . Migraines   . Seasonal allergies    Past Surgical History:  Procedure Laterality Date  . TUBAL LIGATION     Family History  Problem Relation Age of Onset  . Diabetes Unknown        family hx  . Hyperlipidemia Unknown        family hx  . Stroke Unknown        family hx   Social History  Substance Use Topics  . Smoking status: Never Smoker  . Smokeless tobacco: Never Used  . Alcohol use No   OB History    No data available     Review of Systems  Constitutional: Positive for fatigue. Negative for chills and fever.  HENT: Negative.   Eyes: Positive for visual disturbance.  Respiratory: Negative.   Cardiovascular: Positive for chest pain. Negative for palpitations.  Gastrointestinal: Positive for nausea. Negative for constipation, diarrhea and vomiting.  Musculoskeletal: Negative.   Skin:  Negative.   Neurological: Positive for weakness, light-headedness and headaches.    Allergies  Citalopram hydrobromide and Penicillins  Home Medications   Prior to Admission medications   Medication Sig Start Date End Date Taking? Authorizing Provider  amLODipine (NORVASC) 5 MG tablet TAKE 1 TABLET (5 MG TOTAL) BY MOUTH DAILY. 03/13/15  Yes Nelwyn SalisburyFry, Stephen A, MD  Vitamin D, Ergocalciferol, (DRISDOL) 50000 units CAPS capsule Take 50,000 Units by mouth every 7 (seven) days.   Yes [provider]  ALPRAZolam Prudy Feeler(XANAX) 1 MG tablet Take 1 tablet (1 mg total) by mouth 3 (three) times daily as needed. 02/04/15   Nelwyn SalisburyFry, Stephen A, MD  Bimatoprost (LUMIGAN OP) Apply to eye.    [provider]  buPROPion (WELLBUTRIN XL) 300 MG 24 hr tablet TAKE 1 TABLET DAILY 12/17/15   Nelwyn SalisburyFry, Stephen A, MD  COMBIGAN 0.2-0.5 % ophthalmic solution  09/16/15   [provider]  levothyroxine (SYNTHROID, LEVOTHROID) 25 MCG tablet TAKE 1 TABLET DAILY BEFORE BREAKFAST 03/17/16   Nelwyn SalisburyFry, Stephen A, MD   Meds Ordered and Administered this Visit  Medications - No data to display  BP (!) 203/109 (BP Location: Right Arm)   Pulse 63   Temp 99 F (37.2 C) (Oral)   Resp 18   LMP 01/11/2017   SpO2 100%  No data found.   Physical Exam  Constitutional: She is oriented to person, place, and time. She appears well-developed  and well-nourished. No distress.  HENT:  Head: Normocephalic and atraumatic.  Right Ear: External ear normal.  Left Ear: External ear normal.  Eyes: Pupils are equal, round, and reactive to light. Conjunctivae and EOM are normal.  Neck: Normal range of motion. Neck supple.  Cardiovascular: Normal rate and regular rhythm.   Pulmonary/Chest: Effort normal and breath sounds normal.  Lymphadenopathy:    She has no cervical adenopathy.  Neurological: She is alert and oriented to person, place, and time. No cranial nerve deficit or sensory deficit.  Skin: Skin is warm and dry. Capillary refill  takes less than 2 seconds. No rash noted. She is not diaphoretic. No erythema.  Psychiatric: She has a normal mood and affect. Her behavior is normal.  Nursing note and vitals reviewed.   Urgent Care Course     ED EKG Date/Time: 01/14/2017 5:40 PM Performed by: Dorena Bodo Authorized by: Dorena Bodo   ECG reviewed by ED Physician in the absence of a cardiologist: no   Interpretation:    Interpretation: normal   Rate:    ECG rate:  62   ECG rate assessment: normal   Rhythm:    Rhythm: sinus rhythm   Ectopy:    Ectopy: none   QRS:    QRS axis:  Normal   QRS intervals:  Normal Conduction:    Conduction: normal   ST segments:    ST segments:  Normal T waves:    T waves: normal   Comments:     PR 188 ms QRS 82 ms QT/QTc 404/410 ms   (including critical care time)  Labs Review Labs Reviewed - No data to display  Imaging Review No results found.    MDM   1. Hypertensive crisis   2. Weakness   3. Dehydration    Based on signs, symptoms, physical exam findings, this patient needs to be seen in the emergency room for further evaluation and monitoring. Twelve-lead EKG obtained, patient discharged to the ED.    Dorena Bodo, NP 01/14/17 1742

## 2017-01-22 ENCOUNTER — Encounter: Payer: Self-pay | Admitting: Family Medicine

## 2017-01-22 ENCOUNTER — Ambulatory Visit (INDEPENDENT_AMBULATORY_CARE_PROVIDER_SITE_OTHER): Admitting: Family Medicine

## 2017-01-22 VITALS — BP 160/98 | HR 70 | Temp 98.5°F | Ht 66.5 in | Wt 163.0 lb

## 2017-01-22 DIAGNOSIS — I10 Essential (primary) hypertension: Secondary | ICD-10-CM | POA: Diagnosis not present

## 2017-01-22 MED ORDER — HYDROCHLOROTHIAZIDE 25 MG PO TABS: 25.0000 mg | ORAL_TABLET | Freq: Every day | ORAL | 3 refills | Status: DC

## 2017-01-22 MED ORDER — AMLODIPINE BESYLATE 5 MG PO TABS: ORAL_TABLET | ORAL | 3 refills | Status: DC

## 2017-01-22 NOTE — Progress Notes (Signed)
   Subjective:    Patient ID: Brenda Barrett, female    DOB: 1971/06/13, 46 y.o.   MRN: [id number]  HPI Here to follow up an ER visit on 01-14-17 for a hypertensive crisis. She had been feeling tired for a week or so and then that day she has some pain in the right shoulder. No chest pain or SOB. She was seen at Urgent Care with a BP of 203/109, and she was sent to the ER there her BP was 190/111. Her labs were normal including a creatinine of 0.94 and and her EKG was normal. She was started on Amlodipine 5 mg daily. Since then she has felt better and her BP at home is running in the 140s over 90s. We reviewed her notes and test results.    Review of Systems  Constitutional: Negative.   Respiratory: Negative.   Cardiovascular: Negative.   Gastrointestinal: Negative.   Neurological: Negative.        Objective:   Physical Exam  Constitutional: She is oriented to person, place, and time. She appears well-developed and well-nourished.  Neck: No thyromegaly present.  Cardiovascular: Normal rate, regular rhythm, normal heart sounds and intact distal pulses.   Pulmonary/Chest: Effort normal and breath sounds normal.  Musculoskeletal: She exhibits no edema.  Lymphadenopathy:    She has no cervical adenopathy.  Neurological: She is alert and oriented to person, place, and time.          Assessment & Plan:  HTN, improved but still a problem. We will add HCTZ 25 mg daily. Recheck in 2 weeks. Gershon CraneStephen Rayden Scheper, MD

## 2017-01-22 NOTE — Patient Instructions (Signed)
WE NOW OFFER   [facility] FAST TRACK!!!  SAME DAY Appointments for ACUTE CARE  Such as: Sprains, Injuries, cuts, abrasions, rashes, muscle pain, joint pain, back pain Colds, flu, sore throats, headache, allergies, cough, fever  Ear pain, sinus and eye infections Abdominal pain, nausea, vomiting, diarrhea, upset stomach Animal/insect bites  3 Easy Ways to Schedule: Walk-In Scheduling Call in scheduling Mychart Sign-up: [email or web address]         

## 2017-02-12 ENCOUNTER — Ambulatory Visit (INDEPENDENT_AMBULATORY_CARE_PROVIDER_SITE_OTHER): Admitting: Family Medicine

## 2017-02-12 ENCOUNTER — Encounter: Payer: Self-pay | Admitting: Family Medicine

## 2017-02-12 VITALS — BP 131/88 | HR 73 | Temp 98.6°F | Ht 66.5 in | Wt 161.0 lb

## 2017-02-12 DIAGNOSIS — I10 Essential (primary) hypertension: Secondary | ICD-10-CM | POA: Diagnosis not present

## 2017-02-12 NOTE — Patient Instructions (Signed)
WE NOW OFFER   [facility] FAST TRACK!!!  SAME DAY Appointments for ACUTE CARE  Such as: Sprains, Injuries, cuts, abrasions, rashes, muscle pain, joint pain, back pain Colds, flu, sore throats, headache, allergies, cough, fever  Ear pain, sinus and eye infections Abdominal pain, nausea, vomiting, diarrhea, upset stomach Animal/insect bites  3 Easy Ways to Schedule: Walk-In Scheduling Call in scheduling Mychart Sign-up: [email or web address]         

## 2017-02-12 NOTE — Progress Notes (Signed)
   Subjective:    Patient ID: Brenda Barrett, female    DOB: 10/27/1970, 46 y.o.   MRN: [id number]  HPI Here to follow up on HTN. She is now taking Amlodipine and HCTZ, and this is working well. Her BP at home runs 110-120 over 70-80. She feels much better.    Review of Systems  Constitutional: Negative.   Respiratory: Negative.   Cardiovascular: Negative.   Neurological: Negative.        Objective:   Physical Exam  Constitutional: She is oriented to person, place, and time. She appears well-developed and well-nourished.  Cardiovascular: Normal rate, regular rhythm, normal heart sounds and intact distal pulses.   Pulmonary/Chest: Effort normal and breath sounds normal. No respiratory distress. She has no wheezes. She has no rales.  Musculoskeletal: She exhibits no edema.  Neurological: She is alert and oriented to person, place, and time.          Assessment & Plan:  HTN is stable. Stay on current regimen. Recheck in 2 months. Gershon CraneStephen Fry, MD

## 2017-03-18 ENCOUNTER — Ambulatory Visit (INDEPENDENT_AMBULATORY_CARE_PROVIDER_SITE_OTHER): Admitting: Family Medicine

## 2017-03-18 ENCOUNTER — Encounter: Payer: Self-pay | Admitting: Family Medicine

## 2017-03-18 VITALS — BP 131/88 | HR 85 | Temp 98.9°F | Ht 66.5 in | Wt 159.0 lb

## 2017-03-18 DIAGNOSIS — I10 Essential (primary) hypertension: Secondary | ICD-10-CM | POA: Diagnosis not present

## 2017-03-18 DIAGNOSIS — G43101 Migraine with aura, not intractable, with status migrainosus: Secondary | ICD-10-CM | POA: Diagnosis not present

## 2017-03-18 DIAGNOSIS — F418 Other specified anxiety disorders: Secondary | ICD-10-CM | POA: Diagnosis not present

## 2017-03-18 DIAGNOSIS — G43909 Migraine, unspecified, not intractable, without status migrainosus: Secondary | ICD-10-CM | POA: Insufficient documentation

## 2017-03-18 MED ORDER — HYDROCHLOROTHIAZIDE 25 MG PO TABS: 25.0000 mg | ORAL_TABLET | Freq: Every day | ORAL | 3 refills | Status: DC

## 2017-03-18 MED ORDER — SERTRALINE HCL 50 MG PO TABS: 50.0000 mg | ORAL_TABLET | Freq: Every day | ORAL | 3 refills | Status: DC

## 2017-03-18 MED ORDER — AMLODIPINE BESYLATE 5 MG PO TABS: ORAL_TABLET | ORAL | 3 refills | Status: DC

## 2017-03-18 MED ORDER — BUPROPION HCL ER (XL) 300 MG PO TB24: 300.0000 mg | ORAL_TABLET | Freq: Every day | ORAL | 3 refills | Status: DC

## 2017-03-18 MED ORDER — LEVOTHYROXINE SODIUM 25 MCG PO TABS: 25.0000 ug | ORAL_TABLET | Freq: Every day | ORAL | 3 refills | Status: DC

## 2017-03-18 NOTE — Patient Instructions (Signed)
WE NOW OFFER   [facility] FAST TRACK!!!  SAME DAY Appointments for ACUTE CARE  Such as: Sprains, Injuries, cuts, abrasions, rashes, muscle pain, joint pain, back pain Colds, flu, sore throats, headache, allergies, cough, fever  Ear pain, sinus and eye infections Abdominal pain, nausea, vomiting, diarrhea, upset stomach Animal/insect bites  3 Easy Ways to Schedule: Walk-In Scheduling Call in scheduling Mychart Sign-up: [email or web address]         
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
## 2017-04-01 ENCOUNTER — Telehealth: Payer: Self-pay | Admitting: Family Medicine

## 2017-04-01 NOTE — Telephone Encounter (Signed)
° ° ° ° °  Pt call to say she does not think the below med agrees with her. She said it is making her feel depress. Would like a call back    [phone]

## 2017-04-02 NOTE — Telephone Encounter (Signed)
I would give this 2 more weeks to reach a therapeutic level in her system. If she does not feel better by then, we will need to change it

## 2017-04-02 NOTE — Telephone Encounter (Signed)
I left a voice message for pt with below information, also advised pt to return my call.

## 2017-04-05 NOTE — Telephone Encounter (Signed)
I spoke with pt and she will follow below advice, she is already feeling better

## 2017-04-12 ENCOUNTER — Encounter: Payer: Self-pay | Admitting: Family Medicine

## 2017-04-12 ENCOUNTER — Ambulatory Visit (INDEPENDENT_AMBULATORY_CARE_PROVIDER_SITE_OTHER): Admitting: Family Medicine

## 2017-04-12 VITALS — BP 112/81 | HR 61 | Temp 98.5°F | Ht 66.5 in | Wt 155.0 lb

## 2017-04-12 DIAGNOSIS — Z0000 Encounter for general adult medical examination without abnormal findings: Secondary | ICD-10-CM | POA: Diagnosis not present

## 2017-04-12 LAB — CBC WITH DIFFERENTIAL/PLATELET
BASOS PCT: 0.5 % (ref 0.0–3.0)
Basophils Absolute: 0 10*3/uL (ref 0.0–0.1)
EOS ABS: 0.2 10*3/uL (ref 0.0–0.7)
EOS PCT: 3 % (ref 0.0–5.0)
HCT: 35.1 % — ABNORMAL LOW (ref 36.0–46.0)
Hemoglobin: 11.6 g/dL — ABNORMAL LOW (ref 12.0–15.0)
Lymphocytes Relative: 32.4 % (ref 12.0–46.0)
Lymphs Abs: 1.9 10*3/uL (ref 0.7–4.0)
MCHC: 33 g/dL (ref 30.0–36.0)
MCV: 88.3 fl (ref 78.0–100.0)
MONO ABS: 0.4 10*3/uL (ref 0.1–1.0)
Monocytes Relative: 6.6 % (ref 3.0–12.0)
NEUTROS ABS: 3.5 10*3/uL (ref 1.4–7.7)
NEUTROS PCT: 57.5 % (ref 43.0–77.0)
Platelets: 289 10*3/uL (ref 150.0–400.0)
RBC: 3.97 Mil/uL (ref 3.87–5.11)
RDW: 12.6 % (ref 11.5–15.5)
WBC: 6 10*3/uL (ref 4.0–10.5)

## 2017-04-12 LAB — LIPID PANEL
Cholesterol: 303 mg/dL — ABNORMAL HIGH (ref 0–200)
HDL: 43.4 mg/dL (ref >39.00)
NonHDL: 259.57
Total CHOL/HDL Ratio: 7
Triglycerides: 255 mg/dL — ABNORMAL HIGH (ref 0.0–149.0)
VLDL: 51 mg/dL — ABNORMAL HIGH (ref 0.0–40.0)

## 2017-04-12 LAB — BASIC METABOLIC PANEL
BUN: 10 mg/dL (ref 6–23)
CHLORIDE: 98 meq/L (ref 96–112)
CO2: 28 meq/L (ref 19–32)
Calcium: 9.5 mg/dL (ref 8.4–10.5)
Creatinine, Ser: 0.9 mg/dL (ref 0.40–1.20)
GFR: 85.77 mL/min (ref >60.00)
GLUCOSE: 106 mg/dL — AB (ref 70–99)
POTASSIUM: 3.4 meq/L — AB (ref 3.5–5.1)
SODIUM: 136 meq/L (ref 135–145)

## 2017-04-12 LAB — HEPATIC FUNCTION PANEL
ALBUMIN: 4.3 g/dL (ref 3.5–5.2)
ALK PHOS: 90 U/L (ref 39–117)
ALT: 13 U/L (ref 0–35)
AST: 16 U/L (ref 0–37)
Bilirubin, Direct: 0.1 mg/dL (ref 0.0–0.3)
TOTAL PROTEIN: 7.8 g/dL (ref 6.0–8.3)
Total Bilirubin: 0.6 mg/dL (ref 0.2–1.2)

## 2017-04-12 LAB — POC URINALSYSI DIPSTICK (AUTOMATED)
BILIRUBIN UA: NEGATIVE
Blood, UA: NEGATIVE
GLUCOSE UA: NEGATIVE
Ketones, UA: NEGATIVE
Leukocytes, UA: NEGATIVE
Nitrite, UA: NEGATIVE
Protein, UA: NEGATIVE
SPEC GRAV UA: 1.02 (ref 1.010–1.025)
Urobilinogen, UA: 0.2 E.U./dL
pH, UA: 6.5 (ref 5.0–8.0)

## 2017-04-12 LAB — LDL CHOLESTEROL, DIRECT: LDL DIRECT: 189 mg/dL

## 2017-04-12 LAB — TSH: TSH: 0.37 u[IU]/mL (ref 0.35–4.50)

## 2017-04-12 MED ORDER — SERTRALINE HCL 100 MG PO TABS: 100.0000 mg | ORAL_TABLET | Freq: Every day | ORAL | 3 refills | Status: DC

## 2017-04-12 NOTE — Patient Instructions (Signed)
WE NOW OFFER   [facility] FAST TRACK!!!  SAME DAY Appointments for ACUTE CARE  Such as: Sprains, Injuries, cuts, abrasions, rashes, muscle pain, joint pain, back pain Colds, flu, sore throats, headache, allergies, cough, fever  Ear pain, sinus and eye infections Abdominal pain, nausea, vomiting, diarrhea, upset stomach Animal/insect bites  3 Easy Ways to Schedule: Walk-In Scheduling Call in scheduling Mychart Sign-up: [email or web address]         

## 2017-04-12 NOTE — Progress Notes (Signed)
   Subjective:    Patient ID: Brenda Barrett, female    DOB: 09-Dec-1970, 46 y.o.   MRN: [id number]  HPI Here for a well exam. She feels well in general. She is pleased with the Zoloft because she feels happier and less stressed, even when she is at work. She asks of the dose can be increased a bit.    Review of Systems  Constitutional: Negative.   HENT: Negative.   Eyes: Negative.   Respiratory: Negative.   Cardiovascular: Negative.   Gastrointestinal: Negative.   Genitourinary: Negative for decreased urine volume, difficulty urinating, dyspareunia, dysuria, enuresis, flank pain, frequency, hematuria, pelvic pain and urgency.  Musculoskeletal: Negative.   Skin: Negative.   Neurological: Negative.   Psychiatric/Behavioral: Negative.        Objective:   Physical Exam  Constitutional: She is oriented to person, place, and time. She appears well-developed and well-nourished. No distress.  HENT:  Head: Normocephalic and atraumatic.  Right Ear: External ear normal.  Left Ear: External ear normal.  Nose: Nose normal.  Mouth/Throat: Oropharynx is clear and moist. No oropharyngeal exudate.  Eyes: Pupils are equal, round, and reactive to light. Conjunctivae and EOM are normal. No scleral icterus.  Neck: Normal range of motion. Neck supple. No JVD present. No thyromegaly present.  Cardiovascular: Normal rate, regular rhythm, normal heart sounds and intact distal pulses.  Exam reveals no gallop and no friction rub.   No murmur heard. Pulmonary/Chest: Effort normal and breath sounds normal. No respiratory distress. She has no wheezes. She has no rales. She exhibits no tenderness.  Abdominal: Soft. Bowel sounds are normal. She exhibits no distension and no mass. There is no tenderness. There is no rebound and no guarding.  Musculoskeletal: Normal range of motion. She exhibits no edema or tenderness.  Lymphadenopathy:    She has no cervical adenopathy.  Neurological: She is alert and  oriented to person, place, and time. She has normal reflexes. No cranial nerve deficit. She exhibits normal muscle tone. Coordination normal.  Skin: Skin is warm and dry. No rash noted. No erythema.  Psychiatric: She has a normal mood and affect. Her behavior is normal. Judgment and thought content normal.          Assessment & Plan:  Well exam. We discussed diet and exercise. Get fasting labs. For the depression we will increase the Zoloft to 100 mg daily.  Gershon Crane, MD

## 2017-04-14 ENCOUNTER — Other Ambulatory Visit: Payer: Self-pay | Admitting: Family Medicine

## 2017-04-14 MED ORDER — ATORVASTATIN CALCIUM 20 MG PO TABS: 20.0000 mg | ORAL_TABLET | Freq: Every day | ORAL | 0 refills | Status: DC

## 2017-04-14 MED ORDER — POTASSIUM CHLORIDE ER 10 MEQ PO TBCR: 10.0000 meq | EXTENDED_RELEASE_TABLET | Freq: Every day | ORAL | 0 refills | Status: DC

## 2017-05-13 ENCOUNTER — Telehealth: Payer: Self-pay | Admitting: Family Medicine

## 2017-05-13 NOTE — Telephone Encounter (Signed)
° ° ° ° °  Pt call to say that the below med is bothering her sexually and is asking if there is something else she can try     Pharmacy [facility] [address] [place]

## 2017-05-13 NOTE — Telephone Encounter (Signed)
She seemed to do fairly well on 50 mg of Zoloft, so advised her to cut the 100 mg tabs in half and take 1/2 a day

## 2017-05-14 NOTE — Telephone Encounter (Signed)
I spoke with pt and went over below advice. 

## 2017-07-22 ENCOUNTER — Telehealth: Payer: Self-pay | Admitting: Family Medicine

## 2017-07-22 NOTE — Telephone Encounter (Signed)
Copied from CRM [id number]. Topic: Quick Communication - See Telephone Encounter >> Jul 22, 2017  9:27 AM Trula SladeWalter, Linda F wrote: CRM for notification. See Telephone encounter for:  07/22/17. Patient would like her 1) Norvastatin 20mg  2) Potachloride ER medications [facility] [address].

## 2017-07-22 NOTE — Telephone Encounter (Signed)
Last OV 04/12/2017  Did the pt mean atorvastain and and potassium?

## 2017-07-23 MED ORDER — POTASSIUM CHLORIDE ER 10 MEQ PO TBCR: 10.0000 meq | EXTENDED_RELEASE_TABLET | Freq: Every day | ORAL | 0 refills | Status: DC

## 2017-07-23 MED ORDER — ATORVASTATIN CALCIUM 20 MG PO TABS: 20.0000 mg | ORAL_TABLET | Freq: Every day | ORAL | 0 refills | Status: DC

## 2017-07-23 NOTE — Telephone Encounter (Signed)
Pt advised and voiced understanding.   

## 2017-07-23 NOTE — Telephone Encounter (Signed)
Pt wanted to know if Dr. Clent RidgesFry could redue her FLMA for this year w/o an OV. Sent to PCP

## 2017-07-23 NOTE — Telephone Encounter (Signed)
Rx's were sent into pharmacy for pt.  See pior note on pt's request for FLMA paper work sent to PCP

## 2017-07-23 NOTE — Telephone Encounter (Signed)
Yes we can fill out the FMLA papers with no OV needed

## 2017-07-28 ENCOUNTER — Telehealth: Payer: Self-pay

## 2017-07-28 NOTE — Telephone Encounter (Signed)
Copied from CRM [id number]. Topic: General - Other >> Jul 22, 2017  9:31 AM Trula SladeWalter, Linda F wrote: Reason for CRM:  Patient would like to know if Dr. Claris CheFry's assistant will give her a call back.  She needs to know if she can just send in her FMLA forms or do she need to come in for an appt. Please call [phone]  This was addressed on Friday 07/23/2017 - SPB/CMA

## 2017-08-04 ENCOUNTER — Telehealth: Payer: Self-pay | Admitting: Family Medicine

## 2017-08-04 NOTE — Telephone Encounter (Signed)
Placed for PCP to fill out  

## 2017-08-04 NOTE — Telephone Encounter (Signed)
Pt brought in FMLA form to be completed it was put in the doctor's folder for completion and pt would like to be called at [phone] when ready for pick up.

## 2017-08-06 DIAGNOSIS — G43909 Migraine, unspecified, not intractable, without status migrainosus: Secondary | ICD-10-CM

## 2017-08-06 NOTE — Telephone Encounter (Signed)
Called and spoke with pt. Pt advised and voiced understanding. Pt wanted to come by the office and pick up the form today. Forms placed up front for pick up. Copy placed to be scanned into pt's chart.

## 2017-08-06 NOTE — Telephone Encounter (Signed)
The forms are ready  

## 2017-08-24 ENCOUNTER — Telehealth: Payer: Self-pay | Admitting: Family Medicine

## 2017-08-24 NOTE — Telephone Encounter (Signed)
Copied from CRM [id number]. Topic: General - Other >> Aug 24, 2017 12:38 PM Cecelia ByarsGreen, Temeka L, RMA wrote: Reason for CRM: patient is requesting a callback concerning side affect from Zoloft, she want Dr. Claris CheFry's CMA to return call

## 2017-08-24 NOTE — Telephone Encounter (Signed)
I spoke with pt, some symptoms include: memory is fading, painful intercourse & lack of sexual interest. Can any of these be a possible side effect from taking Zoloft? Please advise

## 2017-08-24 NOTE — Telephone Encounter (Signed)
Sent to PCP ?

## 2017-08-25 NOTE — Telephone Encounter (Signed)
Yes these could be side effects. I suggest she go back to taking 50 mg a day (so take 1/2 a tablet a day)

## 2017-08-26 NOTE — Telephone Encounter (Signed)
I spoke with pt and gave advice and updated dose change in chart.

## 2017-08-26 NOTE — Telephone Encounter (Signed)
Called pt and left a VM to call back.  

## 2017-10-01 ENCOUNTER — Telehealth: Payer: Self-pay | Admitting: Family Medicine

## 2017-10-01 MED ORDER — SERTRALINE HCL 100 MG PO TABS: 100.0000 mg | ORAL_TABLET | Freq: Every day | ORAL | 0 refills | Status: DC

## 2017-10-01 MED ORDER — ATORVASTATIN CALCIUM 20 MG PO TABS: 20.0000 mg | ORAL_TABLET | Freq: Every day | ORAL | 0 refills | Status: DC

## 2017-10-01 MED ORDER — HYDROCHLOROTHIAZIDE 25 MG PO TABS: 25.0000 mg | ORAL_TABLET | Freq: Every day | ORAL | 0 refills | Status: DC

## 2017-10-01 NOTE — Telephone Encounter (Signed)
Copied from CRM [id number]. Topic: Quick Communication - Rx Refill/Question >> Oct 01, 2017  3:19 PM Zada GirtLander, Lumin L wrote: Medication: atorvastatin (LIPITOR) 20 MG tablet(7 days supply), hydrochlorothiazide (HYDRODIURIL) 25 MG tablet(7 days supply), sertraline (ZOLOFT) 100 MG tablet(30 days supply, out of med) Has the patient contacted their pharmacy? Yes.   (Agent: If no, request that the patient contact the pharmacy for the refill.) Preferred Pharmacy (with phone number or street name): [facility] [address] - [place] - [address] [place] [id number] Phone: [phone] Fax: [phone] Agent: Please be advised that RX refills may take up to 3 business days. We ask that you follow-up with your pharmacy.  Patient out of town for a  family emergency and needs a 7 day supply of lipitor and hydroduril, and a 30 days supply of Zoloft.

## 2017-10-21 ENCOUNTER — Ambulatory Visit (INDEPENDENT_AMBULATORY_CARE_PROVIDER_SITE_OTHER): Admitting: Nurse Practitioner

## 2017-10-21 ENCOUNTER — Encounter: Payer: Self-pay | Admitting: Nurse Practitioner

## 2017-10-21 VITALS — BP 132/86 | HR 90 | Temp 98.9°F | Ht 66.5 in | Wt 156.4 lb

## 2017-10-21 DIAGNOSIS — M778 Other enthesopathies, not elsewhere classified: Secondary | ICD-10-CM | POA: Diagnosis not present

## 2017-10-21 MED ORDER — DICLOFENAC SODIUM 2 % TD SOLN: 1.0000 [in_us] | Freq: Two times a day (BID) | TRANSDERMAL | 0 refills | Status: DC

## 2017-10-21 MED ORDER — PREDNISONE 20 MG PO TABS: 40.0000 mg | ORAL_TABLET | Freq: Every day | ORAL | 0 refills | Status: DC

## 2017-10-21 MED ORDER — KETOROLAC TROMETHAMINE 30 MG/ML IJ SOLN
30.0000 mg | Freq: Once | INTRAMUSCULAR | Status: AC
  Administered 2017-10-21: 30 mg via INTRAMUSCULAR

## 2017-10-21 NOTE — Progress Notes (Signed)
Subjective:  Patient ID: Brenda Barrett, female    DOB: [birth year]  Age: 47 y.o. MRN: [id number]  CC: Wrist Pain (Woke up this morning and wrist started hurting.)   left hand dominant.  Wrist Pain   The pain is present in the right wrist. This is a new problem. The current episode started today. There has been no history of extremity trauma. The problem occurs constantly. The problem has been unchanged. The quality of the pain is described as aching. The pain is at a severity of 10/10. The pain is severe. Associated symptoms include an inability to bear weight and a limited range of motion. Pertinent negatives include no fever, itching, joint locking, joint swelling, numbness, stiffness or tingling. The symptoms are aggravated by activity. She has tried nothing for the symptoms. Her past medical history is significant for osteoarthritis.   Outpatient Medications Prior to Visit  Medication Sig Dispense Refill  . ALPRAZolam (XANAX) 1 MG tablet Take 1 tablet (1 mg total) by mouth 3 (three) times daily as needed. 270 tablet 1  . amLODipine (NORVASC) 5 MG tablet TAKE 1 TABLET (5 MG TOTAL) BY MOUTH DAILY. 90 tablet 3  . aspirin-acetaminophen-caffeine (EXCEDRIN MIGRAINE) 250-250-65 MG tablet Take 1 tablet by mouth every 6 (six) hours as needed for headache.    Marland Kitchen atorvastatin (LIPITOR) 20 MG tablet Take 1 tablet (20 mg total) by mouth daily. 7 tablet 0  . Bimatoprost (LUMIGAN OP) Place 1 drop into both eyes daily.     Marland Kitchen buPROPion (WELLBUTRIN XL) 300 MG 24 hr tablet Take 1 tablet (300 mg total) by mouth daily. 90 tablet 3  . cholecalciferol (VITAMIN D) 1000 units tablet Take 1,000 Units by mouth daily.    . clindamycin (CLEOCIN) 150 MG capsule TAKE 2 CAPSULES BY MOUTH 4 TIMES A DAY UNTIL GONE  0  . COMBIGAN 0.2-0.5 % ophthalmic solution Place 1 drop into both eyes daily.     . hydrochlorothiazide (HYDRODIURIL) 25 MG tablet Take 1 tablet (25 mg total) by mouth daily. 7 tablet 0  . levothyroxine  (SYNTHROID, LEVOTHROID) 25 MCG tablet Take 1 tablet (25 mcg total) by mouth daily before breakfast. 90 tablet 3  . MAGNESIUM PO Take by mouth daily.    . potassium chloride (KLOR-CON 10) 10 MEQ tablet Take 1 tablet (10 mEq total) by mouth daily. 90 tablet 0  . sertraline (ZOLOFT) 100 MG tablet Take 1 tablet (100 mg total) by mouth daily. 30 tablet 0   No facility-administered medications prior to visit.     ROS See HPI  Objective:  BP 132/86 (BP Location: Left Arm, Patient Position: Sitting, Cuff Size: Normal)   Pulse 90   Temp 98.9 F (37.2 C) (Oral)   Ht 5' 6.5" (1.689 m)   Wt 156 lb 6.4 oz (70.9 kg)   SpO2 97%   BMI 24.87 kg/m   BP Readings from Last 3 Encounters:  10/21/17 132/86  04/12/17 112/81  03/18/17 131/88    Wt Readings from Last 3 Encounters:  10/21/17 156 lb 6.4 oz (70.9 kg)  04/12/17 155 lb (70.3 kg)  03/18/17 159 lb (72.1 kg)    Physical Exam  Constitutional: No distress.  Cardiovascular: Normal rate.  Pulmonary/Chest: Effort normal.  Musculoskeletal: She exhibits tenderness. She exhibits no edema or deformity.       Right elbow: Normal.      Right wrist: She exhibits tenderness and bony tenderness. She exhibits normal range of motion, no swelling, no effusion and  no laceration.       Right forearm: Normal.       Right hand: She exhibits normal range of motion and no tenderness.  Unable to assess finger and hand strength due to wrist pain.  Tenderness along radial aspect of wrist. No muscle atrophy.  Neurological: She is alert.  Skin: Skin is warm and dry. No rash noted. No erythema.  Vitals reviewed.   Lab Results  Component Value Date   WBC 6.0 04/12/2017   HGB 11.6 (L) 04/12/2017   HCT 35.1 (L) 04/12/2017   PLT 289.0 04/12/2017   GLUCOSE 106 (H) 04/12/2017   CHOL 303 (H) 04/12/2017   TRIG 255.0 (H) 04/12/2017   HDL 43.40 04/12/2017   LDLDIRECT 189.0 04/12/2017   LDLCALC 169 12/08/2016   ALT 13 04/12/2017   AST 16 04/12/2017   NA 136  04/12/2017   K 3.4 (L) 04/12/2017   CL 98 04/12/2017   CREATININE 0.90 04/12/2017   BUN 10 04/12/2017   CO2 28 04/12/2017   TSH 0.37 04/12/2017   HGBA1C 5.6 12/08/2016    Dg Chest 2 View  Result Date: 01/14/2017 CLINICAL DATA:  Right arm pain for 1 day.  Hypertension. EXAM: CHEST  2 VIEW COMPARISON:  07/11/2007 FINDINGS: The heart size and mediastinal contours are within normal limits. Both lungs are clear. The visualized skeletal structures are unremarkable. IMPRESSION: No active cardiopulmonary disease. Electronically Signed   By: Myles RosenthalJohn  Stahl M.D.   On: 01/14/2017 18:36    Assessment & Plan:   Brenda Barrett was seen today for wrist pain.  Diagnoses and all orders for this visit:  Right wrist tendinitis -     Diclofenac Sodium (PENNSAID) 2 % SOLN; Place 1 inch onto the skin 2 (two) times daily. -     predniSONE (DELTASONE) 20 MG tablet; Take 2 tablets (40 mg total) by mouth daily with breakfast. -     ketorolac (TORADOL) 30 MG/ML injection 30 mg   I am having Brenda Barrett start on Diclofenac Sodium and predniSONE. I am also having her maintain her ALPRAZolam, COMBIGAN, Bimatoprost (LUMIGAN OP), aspirin-acetaminophen-caffeine, cholecalciferol, MAGNESIUM PO, amLODipine, buPROPion, levothyroxine, potassium chloride, sertraline, hydrochlorothiazide, atorvastatin, and clindamycin. We administered ketorolac.  Meds ordered this encounter  Medications  . Diclofenac Sodium (PENNSAID) 2 % SOLN    Sig: Place 1 inch onto the skin 2 (two) times daily.    Dispense:  2 g    Refill:  0    Order Specific Question:   Supervising Provider    Answer:   Dianne DunARON, TALIA M [[id number]]  . predniSONE (DELTASONE) 20 MG tablet    Sig: Take 2 tablets (40 mg total) by mouth daily with breakfast.    Dispense:  6 tablet    Refill:  0    Order Specific Question:   Supervising Provider    Answer:   Dianne DunARON, TALIA M [[id number]]  . ketorolac (TORADOL) 30 MG/ML injection 30 mg    Follow-up: No follow-ups on  file.  Brenda Pennaharlotte Ivis Henneman, NP

## 2017-10-21 NOTE — Patient Instructions (Signed)
Use wrist brace with metal bar daily x 1week. Apply cold compress 2-3 times a day (15-7120mins at a time).  Return to office if no improvement in 1week  Wrist Pain, Adult There are many things that can cause wrist pain. Some common causes include:  An injury to the wrist area, such as a sprain, strain, or fracture.  Overuse of the joint.  A condition that causes increased pressure on a nerve in the wrist (carpal tunnel syndrome).  Wear and tear of the joints that occurs with aging (osteoarthritis).  A variety of other types of arthritis.  Sometimes, the cause of wrist pain is not known. Often, the pain goes away when you follow instructions from your health care provider for relieving pain at home, such as resting or icing the wrist. If your wrist pain continues, it is important to tell your health care provider. Follow these instructions at home:  Rest the wrist area for at least 48 hours or as long as told by your health care provider.  If a splint or elastic bandage has been applied, use it as told by your health care provider. ? Remove the splint or bandage only as told by your health care provider. ? Loosen the splint or bandage if your fingers tingle, become numb, or turn cold or blue.  If directed, apply ice to the injured area. ? If you have a removable splint or elastic bandage, remove it as told by your health care provider. ? Put ice in a plastic bag. ? Place a towel between your skin and the bag or between your splint or bandage and the bag. ? Leave the ice on for 20 minutes, 2-3 times a day.  Keep your arm raised (elevated) above the level of your heart while you are sitting or lying down.  Take over-the-counter and prescription medicines only as told by your health care provider.  Keep all follow-up visits as told by your health care provider. This is important. Contact a health care provider if:  You have a sudden sharp pain in the wrist, hand, or arm that is  different or new.  The swelling or bruising on your wrist or hand gets worse.  Your skin becomes red, gets a rash, or has open sores.  Your pain does not get better or it gets worse. Get help right away if:  You lose feeling in your fingers or hand.  Your fingers turn white, very red, or cold and blue.  You cannot move your fingers.  You have a fever or chills. This information is not intended to replace advice given to you by your health care provider. Make sure you discuss any questions you have with your health care provider. Document Released: 04/01/2005 Document Revised: 01/16/2016 Document Reviewed: 01/09/2016 Elsevier Interactive Patient Education  [employer]2018 Elsevier Inc.

## 2017-10-26 ENCOUNTER — Encounter: Payer: Self-pay | Admitting: Family Medicine

## 2017-10-26 ENCOUNTER — Ambulatory Visit (INDEPENDENT_AMBULATORY_CARE_PROVIDER_SITE_OTHER): Admitting: Family Medicine

## 2017-10-26 ENCOUNTER — Other Ambulatory Visit: Payer: Self-pay | Admitting: Nurse Practitioner

## 2017-10-26 VITALS — BP 120/64 | HR 98 | Temp 98.2°F | Ht 66.5 in | Wt 157.8 lb

## 2017-10-26 DIAGNOSIS — M778 Other enthesopathies, not elsewhere classified: Secondary | ICD-10-CM

## 2017-10-26 MED ORDER — POTASSIUM CHLORIDE ER 10 MEQ PO TBCR: 10.0000 meq | EXTENDED_RELEASE_TABLET | Freq: Every day | ORAL | 0 refills | Status: DC

## 2017-10-26 MED ORDER — POTASSIUM CHLORIDE ER 10 MEQ PO TBCR: 10.0000 meq | EXTENDED_RELEASE_TABLET | Freq: Every day | ORAL | 3 refills | Status: DC

## 2017-10-26 MED ORDER — DICLOFENAC SODIUM 75 MG PO TBEC: 75.0000 mg | DELAYED_RELEASE_TABLET | Freq: Two times a day (BID) | ORAL | 2 refills | Status: DC

## 2017-10-26 NOTE — Progress Notes (Signed)
   Subjective:    Patient ID: Brenda Barrett, female    DOB: 08-02-1970, 47 y.o.   MRN: [id number]  HPI Here to follow up on right wrist pain. She works with both her hands at the [employer]. She woke up on the morning of 10-21-17 with swelling and pain in the wrist. No recent trauma. She was seen at another clinic and was diagnosed with tendonitis. She was told to wear a wrist brace, she was given a shot of Toradol and she was given 3 days of Prednisone and some Diclodenac gel to apply. She was put out of work. Today the wrist feels better with less swelling, but she still has some pain.    Review of Systems  Constitutional: Negative.   Respiratory: Negative.   Cardiovascular: Negative.   Musculoskeletal: Positive for arthralgias and joint swelling.       Objective:   Physical Exam  Constitutional: She appears well-developed and well-nourished.  Cardiovascular: Normal rate, regular rhythm, normal heart sounds and intact distal pulses.  Pulmonary/Chest: Effort normal and breath sounds normal. No respiratory distress. She has no wheezes. She has no rales.  Musculoskeletal:  The right wrist shows no swelling but she is tender over th entire wrist. Full ROM.           Assessment & Plan:  Wrist tendonitis. We will keep her out of work at least another week. Wear the brace and rest it. Apply ice packs 2-3 times a day. Start on oral Diclofenac bid. Recheck here in one week.  Gershon CraneStephen Leelan Rajewski, MD

## 2017-11-02 ENCOUNTER — Encounter: Payer: Self-pay | Admitting: Family Medicine

## 2017-11-02 ENCOUNTER — Ambulatory Visit (INDEPENDENT_AMBULATORY_CARE_PROVIDER_SITE_OTHER): Admitting: Family Medicine

## 2017-11-02 VITALS — BP 108/64 | HR 68 | Temp 98.2°F | Ht 66.5 in | Wt 155.2 lb

## 2017-11-02 DIAGNOSIS — M25531 Pain in right wrist: Secondary | ICD-10-CM

## 2017-11-02 NOTE — Progress Notes (Signed)
   Subjective:    Patient ID: Brenda Barrett, female    DOB: [birth year], 47 y.o.   MRN: [id number]  HPI Here to follow up on right wrist tendonitis. She has been wearing a brace, icing the wrist, and taking NSAIDs. It has improved a bit but not much.    Review of Systems  Constitutional: Negative.   Respiratory: Negative.   Cardiovascular: Negative.   Musculoskeletal: Positive for arthralgias.       Objective:   Physical Exam  Constitutional: She appears well-developed and well-nourished.  Cardiovascular: Normal rate, regular rhythm, normal heart sounds and intact distal pulses.  Pulmonary/Chest: Effort normal and breath sounds normal.  Musculoskeletal:  The swelling is down in the wrist but it is still tender with reduced ROM           Assessment & Plan:  Wrist tendonitis, refer to Hand Surgery. Written out of work from 10-21-17 until 11-18-17.  Gershon Crane, MD

## 2017-11-15 ENCOUNTER — Other Ambulatory Visit: Payer: Self-pay | Admitting: Family Medicine

## 2017-12-01 ENCOUNTER — Ambulatory Visit (INDEPENDENT_AMBULATORY_CARE_PROVIDER_SITE_OTHER): Admitting: Family Medicine

## 2017-12-01 ENCOUNTER — Encounter: Payer: Self-pay | Admitting: Family Medicine

## 2017-12-01 VITALS — BP 110/60 | HR 67 | Temp 98.6°F | Ht 66.5 in | Wt 160.0 lb

## 2017-12-01 DIAGNOSIS — M25531 Pain in right wrist: Secondary | ICD-10-CM

## 2017-12-01 MED ORDER — AMLODIPINE BESYLATE 5 MG PO TABS: ORAL_TABLET | ORAL | 3 refills | Status: DC

## 2017-12-01 NOTE — Progress Notes (Signed)
   Subjective:    Patient ID: Brenda Barrett, female    DOB: 04/30/1971, 47 y.o.   MRN: [id number]  HPI Here to follow up on right wrist pain. We last saw her on 11-02-17 for this and we had her taking NSAIDs, icing it, and wearing a brace. We wrote her out of work from 10-21-17 until 11-18-17. She saw Dr. Jodi Geralds at [facility] on 11-15-17 and he diagnosed her with first extensor compartment pain. He injected the wrist with Marcaine and Betamethasone, and told her she could go back to full job duties and hours on 11-22-17. The pain was much improved for a few days, but when she went back to work the pain quickly returned just as intense as before. She had to leave work early on 11-24-17, and she has not been back since. She is wearing the brace and taking Diclofenac. She is scheduled to return to Dr. Luiz Blare on 12-14-17 for a second steroid injection. Her concern today is that she cannot perform her job duties at either a full schedule of hours or at the level of usage of her wrist that would be normal. She has submitted this to the [employer] as a Workers Comp injury, but she is waiting to see is this will be accepted or not. She still has a lot of constant pain in the wrist.    Review of Systems  Constitutional: Negative.   Respiratory: Negative.   Cardiovascular: Negative.   Musculoskeletal: Positive for arthralgias.  Neurological: Negative.        Objective:   Physical Exam  Constitutional: She is oriented to person, place, and time. She appears well-developed and well-nourished.  Cardiovascular: Normal rate, regular rhythm, normal heart sounds and intact distal pulses.  Pulmonary/Chest: Effort normal and breath sounds normal.  Musculoskeletal:  The right wrist shows no swelling or warmth or erythema, but it is very tender   Neurological: She is alert and oriented to person, place, and time.          Assessment & Plan:  Wrist tendonitis. It is obvious that she cannot  work at all for the time being because all her work duties involve repetitive lifting or grasping with both hands. We will write her totally out of work from 11-24-17 until 12-20-17. She will follow up with Dr. Luiz Blare as scheduled. She may need to return to work with duty restrictions or with reduced times. We will see her back after the second steroid injection to discuss this.  Gershon Crane, MD

## 2018-01-07 ENCOUNTER — Telehealth: Payer: Self-pay | Admitting: *Deleted

## 2018-01-07 MED ORDER — LEVOTHYROXINE SODIUM 25 MCG PO TABS: 25.0000 ug | ORAL_TABLET | Freq: Every day | ORAL | 1 refills | Status: DC

## 2018-01-07 NOTE — Telephone Encounter (Signed)
Copied from CRM [id number]. Topic: General - Other >> Jan 07, 2018  2:01 PM Elliot GaultBell, Tiffany M wrote: Relation to pt: self  Call back number:[phone] [facility] [address] - [place], [place] - [id number] [phone] (Fax)  Reason for call:  Patient requesting 90 day supply levothyroxine (SYNTHROID, LEVOTHROID) 25 MCG tablet, patient informed please allow 48 to 72 hour turn around, please advise

## 2018-01-07 NOTE — Telephone Encounter (Signed)
Rx done. 

## 2018-02-23 ENCOUNTER — Other Ambulatory Visit: Payer: Self-pay | Admitting: Family Medicine

## 2018-03-04 ENCOUNTER — Encounter: Payer: Self-pay | Admitting: Family Medicine

## 2018-03-04 ENCOUNTER — Other Ambulatory Visit: Payer: Self-pay

## 2018-03-04 ENCOUNTER — Telehealth: Payer: Self-pay | Admitting: Family Medicine

## 2018-03-04 ENCOUNTER — Ambulatory Visit (INDEPENDENT_AMBULATORY_CARE_PROVIDER_SITE_OTHER): Admitting: Family Medicine

## 2018-03-04 ENCOUNTER — Ambulatory Visit: Payer: Self-pay | Admitting: Family Medicine

## 2018-03-04 VITALS — BP 118/64 | HR 72 | Temp 98.6°F | Ht 66.5 in | Wt 164.6 lb

## 2018-03-04 DIAGNOSIS — M542 Cervicalgia: Secondary | ICD-10-CM

## 2018-03-04 MED ORDER — METHYLPREDNISOLONE 4 MG PO TBPK: ORAL_TABLET | ORAL | 5 refills | Status: DC

## 2018-03-04 MED ORDER — METHOCARBAMOL 750 MG PO TABS: 750.0000 mg | ORAL_TABLET | Freq: Four times a day (QID) | ORAL | 5 refills | Status: AC | PRN

## 2018-03-04 MED ORDER — METHOCARBAMOL 750 MG PO TABS: 750.0000 mg | ORAL_TABLET | Freq: Four times a day (QID) | ORAL | 5 refills | Status: DC | PRN

## 2018-03-04 NOTE — Telephone Encounter (Signed)
Copied from CRM [id number]. Topic: Quick Communication - Rx Refill/Question >> Mar 04, 2018  4:29 PM Alexander BergeronBarksdale, Harvey B wrote: Medication: methocarbamol (ROBAXIN-750) 750 MG tablet [[id number]][[id number]]  methylPREDNISolone (MEDROL DOSEPAK) 4 MG TBPK tablet [[id number]][[id number]]   Pt called and changed her mind; pt would like to [facility] [place]; contact pt if needed

## 2018-03-04 NOTE — Progress Notes (Signed)
   Subjective:    Patient ID: Brenda Barrett, female    DOB: November 02, 1970, 47 y.o.   MRN: [id number]  HPI Here for 4 weeks of stiffness and pain in the left side of the neck. No recent trauma. Using heat, Icy Hot, and Aleve. She has been able to work with this.    Review of Systems  Constitutional: Negative.   Respiratory: Negative.   Cardiovascular: Negative.   Musculoskeletal: Positive for neck pain and neck stiffness.       Objective:   Physical Exam  Constitutional: She appears well-developed and well-nourished.  HENT:  Right Ear: External ear normal.  Left Ear: External ear normal.  Nose: Nose normal.  Mouth/Throat: Oropharynx is clear and moist.  Eyes: Conjunctivae are normal.  Neck: No thyromegaly present.  Pulmonary/Chest: Effort normal and breath sounds normal.  Musculoskeletal:  The left neck has some spasm and is tender. ROM is somewhat limited   Lymphadenopathy:    She has no cervical adenopathy.          Assessment & Plan:  Neck pain, try Robaxin and a Medrol dose pack.  Gershon CraneStephen Cataleyah Colborn, MD

## 2018-03-04 NOTE — Telephone Encounter (Signed)
[facility] [address] [facility] [place].

## 2018-03-08 ENCOUNTER — Other Ambulatory Visit: Payer: Self-pay

## 2018-03-08 ENCOUNTER — Telehealth: Payer: Self-pay

## 2018-03-08 MED ORDER — METHYLPREDNISOLONE 4 MG PO TBPK: ORAL_TABLET | ORAL | 0 refills | Status: DC

## 2018-03-08 NOTE — Telephone Encounter (Signed)
This should read #21 with no refills. Please call this in

## 2018-03-08 NOTE — Telephone Encounter (Signed)
Fax from [facility]   *Clarification*  Methylpred 4 MG  Was the disp amount ment to be 60 with 5 refills ??  OR  disp 21 with no refills?   Sent to PCP to advise

## 2018-03-08 NOTE — Telephone Encounter (Signed)
Rx has been resent to the pharmacy. Called the pharmacy as well to give the clarification needed.

## 2018-03-15 ENCOUNTER — Other Ambulatory Visit: Payer: Self-pay | Admitting: Family Medicine

## 2018-04-05 ENCOUNTER — Encounter

## 2018-04-05 ENCOUNTER — Encounter: Payer: Self-pay | Admitting: Family Medicine

## 2018-04-05 ENCOUNTER — Ambulatory Visit (INDEPENDENT_AMBULATORY_CARE_PROVIDER_SITE_OTHER): Admitting: Family Medicine

## 2018-04-05 VITALS — BP 120/74 | HR 71 | Temp 98.4°F | Wt 165.1 lb

## 2018-04-05 DIAGNOSIS — G43101 Migraine with aura, not intractable, with status migrainosus: Secondary | ICD-10-CM

## 2018-04-05 DIAGNOSIS — J301 Allergic rhinitis due to pollen: Secondary | ICD-10-CM

## 2018-04-05 MED ORDER — SUMATRIPTAN SUCCINATE 100 MG PO TABS: 100.0000 mg | ORAL_TABLET | ORAL | 0 refills | Status: DC | PRN

## 2018-04-05 MED ORDER — METHYLPREDNISOLONE ACETATE 80 MG/ML IJ SUSP
120.0000 mg | Freq: Once | INTRAMUSCULAR | Status: AC
  Administered 2018-04-05: 120 mg via INTRAMUSCULAR

## 2018-04-05 MED ORDER — AMLODIPINE BESYLATE 5 MG PO TABS: ORAL_TABLET | ORAL | 3 refills | Status: DC

## 2018-04-05 MED ORDER — SUMATRIPTAN SUCCINATE 100 MG PO TABS: 100.0000 mg | ORAL_TABLET | ORAL | 3 refills | Status: DC | PRN

## 2018-04-05 NOTE — Progress Notes (Signed)
   Subjective:    Patient ID: Brenda Barrett, female    DOB: 05-01-1971, 47 y.o.   MRN: [id number]  HPI Here for 10 days of headaches with sinus pressure, itchy eyes, and PND. No cough or fever. Now she has a migraine for several days. These cause severe headaches with nausea and light sensitivity. She has run out of Imitrex. Drinking fluids and taking Zyrtec and Flonase.    Review of Systems  Constitutional: Negative.   HENT: Positive for congestion, postnasal drip and sinus pressure. Negative for sinus pain and sore throat.   Eyes: Negative.   Respiratory: Negative.   Neurological: Positive for headaches.       Objective:   Physical Exam  Constitutional: She is oriented to person, place, and time. She appears well-developed and well-nourished.  Photophobic   HENT:  Right Ear: External ear normal.  Left Ear: External ear normal.  Nose: Nose normal.  Mouth/Throat: Oropharynx is clear and moist.  Eyes: Conjunctivae are normal.  Neck: Neck supple. No thyromegaly present.  Pulmonary/Chest: Effort normal and breath sounds normal.  Lymphadenopathy:    She has no cervical adenopathy.  Neurological: She is alert and oriented to person, place, and time.          Assessment & Plan:  She has been dealing with allergies and now has a migraine on top of that. Given a steroid shot. Imitrex was refilled. She is written out of work from 03-25-18 until 04-06-18. Gershon Crane, MD

## 2018-05-27 ENCOUNTER — Ambulatory Visit: Payer: Self-pay | Admitting: Hematology

## 2018-05-27 NOTE — Telephone Encounter (Signed)
It she moves head to fast, she gets "swimmy headed", "really hot".  Been going on for 3-4 days.  Never passed out completely.  No head injuries. Denies strenuous exercise or pulled muscles.  No new medications, or change in routine.  Patient denies headache. Does have some pain in the back of the head but she doesn't describe it as a headache. / Patient states she has been out of her thyroid medication for about 1 week or so. / Called and talked with Amalia Haileyustin at the practice.  Per Clent RidgesFry schedule her at the Saturday clinic.  Appoint[facility]  Patient verbalizes understanding that appointment will be at the [place] location.  Reason for Disposition . [1] MILD dizziness (e.g., walking normally) AND [2] has NOT been evaluated by physician for this  (Exception: dizziness caused by heat exposure, sudden standing, or poor fluid intake)  Answer Assessment - Initial Assessment Questions 1. DESCRIPTION: "Describe your dizziness."     Swimmy headed, really hot 2. LIGHTHEADED: "Do you feel lightheaded?" (e.g., somewhat faint, woozy, weak upon standing) walking  4. SEVERITY: "How bad is it?"  "Do you feel like you are going to faint?" "Can you stand and walk?" Patient states she has to hold on when she walks   - MODERATE - interferes with normal activities (e.g., work, school)   5. ONSET:  "When did the dizziness begin?"   3-4 days ago 6. AGGRAVATING FACTORS: "Does anything make it worse?" (e.g., standing, change in head position)     Movement of her head  8. CAUSE: "What do you think is causing the dizziness?" Unsure, patient has stopped thyroid medication about 1 week ago  10. OTHER SYMPTOMS: "Do you have any other symptoms?" (e.g., fever, chest pain, vomiting, diarrhea, bleeding)    Sore spot on back of head  Protocols used: DIZZINESS [facility]- LIGHTHEADEDNESS-A-AH

## 2018-05-28 ENCOUNTER — Ambulatory Visit: Admitting: Family Medicine

## 2018-06-08 ENCOUNTER — Other Ambulatory Visit: Payer: Self-pay | Admitting: Family Medicine

## 2018-06-08 MED ORDER — BUPROPION HCL ER (XL) 300 MG PO TB24: 300.0000 mg | ORAL_TABLET | Freq: Every day | ORAL | 0 refills | Status: DC

## 2018-06-08 MED ORDER — LEVOTHYROXINE SODIUM 25 MCG PO TABS: 25.0000 ug | ORAL_TABLET | Freq: Every day | ORAL | 0 refills | Status: DC

## 2018-06-08 NOTE — Telephone Encounter (Signed)
Copied from CRM [id number]. Topic: Quick Communication - Rx Refill/Question >> Jun 08, 2018 10:42 AM Mickel BaasMcGee, Dink Creps B, NT wrote: Medication: levothyroxine (SYNTHROID, LEVOTHROID) 25 MCG tablet  buPROPion (WELLBUTRIN XL) 300 MG 24 hr tablet  Has the patient contacted their pharmacy? Yes.   (Agent: If no, request that the patient contact the pharmacy for the refill.) (Agent: If yes, when and what did the pharmacy advise?)  Preferred Pharmacy (with phone number or street name): [id number] - [place], [place] - [address]  Agent: Please be advised that RX refills may take up to 3 business days. We ask that you follow-up with your pharmacy.

## 2018-06-08 NOTE — Telephone Encounter (Signed)
Patient is scheduled for appointment tomorrow. Courtesy refill given- patient is due yearly lab for TSH. Requested Prescriptions  Pending Prescriptions Disp Refills  . levothyroxine (SYNTHROID, LEVOTHROID) 25 MCG tablet 90 tablet 0    Sig: Take 1 tablet (25 mcg total) by mouth daily before breakfast.     Endocrinology:  Hypothyroid Agents Failed - 06/08/2018 11:04 AM      Failed - TSH needs to be rechecked within 3 months after an abnormal result. Refill until TSH is due.      Failed - TSH in normal range and within 360 days    TSH  Date Value Ref Range Status  04/12/2017 0.37 0.35 - 4.50 uIU/mL Final         Passed - Valid encounter within last 12 months    Recent Outpatient Visits          2 months ago Migraine with aura and with status migrainosus, not intractable   [facility] Tera MaterStephen A, MD   3 months ago Neck pain   [facility], Tera MaterStephen A, MD   6 months ago Right wrist pain   [facility], Tera MaterStephen A, MD   7 months ago Right wrist pain   [facility], Tera MaterStephen A, MD   7 months ago Right wrist tendonitis   [facility], Tera MaterStephen A, MD      Future Appointments            Tomorrow Nelwyn SalisburyFry, Stephen A, [facility]         . buPROPion (WELLBUTRIN XL) 300 MG 24 hr tablet 90 tablet 0    Sig: Take 1 tablet (300 mg total) by mouth daily.     Psychiatry: Antidepressants - bupropion Passed - 06/08/2018 11:04 AM      Passed - Completed PHQ-2 or PHQ-9 in the last 360 days.      Passed - Last BP in normal range    BP Readings from Last 1 Encounters:  04/05/18 120/74         Passed - Valid encounter within last 6 months    Recent Outpatient Visits          2 months ago Migraine with aura and with status migrainosus, not intractable   [facility], Tera MaterStephen A, MD   3 months ago Neck pain   [facility], Tera MaterStephen A, MD   6 months ago Right wrist pain   [facility], Tera MaterStephen A, MD   7 months ago Right wrist pain   [facility], Tera MaterStephen A, MD   7 months ago Right wrist tendonitis   [facility], Tera MaterStephen A, MD      Future Appointments            Tomorrow Nelwyn SalisburyFry, Stephen A, [facility]

## 2018-06-09 ENCOUNTER — Encounter: Payer: Self-pay | Admitting: Family Medicine

## 2018-06-09 ENCOUNTER — Ambulatory Visit (INDEPENDENT_AMBULATORY_CARE_PROVIDER_SITE_OTHER): Admitting: Family Medicine

## 2018-06-09 VITALS — BP 120/80 | HR 82 | Temp 98.3°F | Wt 165.4 lb

## 2018-06-09 DIAGNOSIS — J019 Acute sinusitis, unspecified: Secondary | ICD-10-CM | POA: Diagnosis not present

## 2018-06-09 MED ORDER — AZITHROMYCIN 250 MG PO TABS: ORAL_TABLET | ORAL | 0 refills | Status: DC

## 2018-06-09 MED ORDER — HYDROCODONE-HOMATROPINE 5-1.5 MG/5ML PO SYRP: 5.0000 mL | ORAL_SOLUTION | ORAL | 0 refills | Status: DC | PRN

## 2018-06-09 NOTE — Progress Notes (Signed)
   Subjective:    Patient ID: Brenda Barrett, female    DOB: [birth year], 47 y.o.   MRN: [id number]  HPI Here for 2 days of sinus pressure, PND, ST, hoarseness and a dry cough. No fever. Using Coricidin.    Review of Systems  Constitutional: Negative.   HENT: Positive for congestion, postnasal drip, sinus pressure, sore throat and voice change. Negative for sinus pain.   Eyes: Negative.   Respiratory: Positive for cough.        Objective:   Physical Exam  Constitutional: She appears well-developed and well-nourished.  HENT:  Right Ear: External ear normal.  Left Ear: External ear normal.  Nose: Nose normal.  Mouth/Throat: Oropharynx is clear and moist.  Eyes: Conjunctivae are normal.  Neck: No thyromegaly present.  Pulmonary/Chest: Effort normal and breath sounds normal. No stridor. No respiratory distress. She has no wheezes. She has no rales.  Lymphadenopathy:    She has no cervical adenopathy.          Assessment & Plan:  Sinusitis, treat with a Zpack. Written out of work 06-03-18 and from 06-08-18 until 06-13-18.  Gershon CraneStephen Sydna Brodowski, MD

## 2018-06-16 ENCOUNTER — Telehealth: Payer: Self-pay

## 2018-06-16 NOTE — Telephone Encounter (Signed)
Copied from CRM [id number]. Topic: General - Other >> Jun 16, 2018  3:27 PM [name], Brenda Barrett wrote: Reason for CRM: Patient just returned to work today and needs a note from Dr. Abran CantorFrye to refelct the full time frame that she was out of work. The note she received after her visit on 06/09/2018. Please cal patient to advise.

## 2018-06-17 NOTE — Telephone Encounter (Signed)
Called and spoke with pt and she is aware that the note has been completed and left up front for her to pick up.

## 2018-06-17 NOTE — Telephone Encounter (Signed)
Yes please give her a note to cover 06-09-18 through yesterday

## 2018-06-17 NOTE — Telephone Encounter (Signed)
Dr. Clent RidgesFry please advise if ok to do work note for the pt.  Thanks

## 2018-07-04 ENCOUNTER — Encounter: Payer: Self-pay | Admitting: Family Medicine

## 2018-07-04 ENCOUNTER — Ambulatory Visit (INDEPENDENT_AMBULATORY_CARE_PROVIDER_SITE_OTHER): Admitting: Family Medicine

## 2018-07-04 VITALS — BP 120/84 | HR 66 | Temp 98.7°F | Wt 167.4 lb

## 2018-07-04 DIAGNOSIS — G43101 Migraine with aura, not intractable, with status migrainosus: Secondary | ICD-10-CM

## 2018-07-04 MED ORDER — LEVOTHYROXINE SODIUM 25 MCG PO TABS: 25.0000 ug | ORAL_TABLET | Freq: Every day | ORAL | 3 refills | Status: DC

## 2018-07-04 MED ORDER — BUPROPION HCL ER (XL) 300 MG PO TB24: 300.0000 mg | ORAL_TABLET | Freq: Every day | ORAL | 3 refills | Status: DC

## 2018-07-04 MED ORDER — SERTRALINE HCL 100 MG PO TABS: 100.0000 mg | ORAL_TABLET | Freq: Every day | ORAL | 3 refills | Status: DC

## 2018-07-04 MED ORDER — KETOROLAC TROMETHAMINE 60 MG/2ML IM SOLN
60.0000 mg | Freq: Once | INTRAMUSCULAR | Status: AC
  Administered 2018-07-04: 60 mg via INTRAMUSCULAR

## 2018-07-04 MED ORDER — TOPIRAMATE 25 MG PO TABS: 25.0000 mg | ORAL_TABLET | Freq: Every day | ORAL | 2 refills | Status: DC

## 2018-07-04 NOTE — Progress Notes (Signed)
   Subjective:    Patient ID: Pat PatrickCharlene F Wence, female    DOB: 1970/11/27, 47 y.o.   MRN: [id number]  HPI Here for a migraine headache that she had when she woke up this morning. She has not vomited. She tried Imitrex and Excedrin Migraine with no relief. She notes that the migraines are coming about once a week now.    Review of Systems  Constitutional: Negative.   HENT: Negative.   Eyes: Negative.   Respiratory: Negative.   Cardiovascular: Negative.   Neurological: Positive for headaches.       Objective:   Physical Exam Constitutional:      Comments: In pain, photophobic   Eyes:     Extraocular Movements: Extraocular movements intact.     Conjunctiva/sclera: Conjunctivae normal.     Pupils: Pupils are equal, round, and reactive to light.  Neck:     Musculoskeletal: Neck supple.  Cardiovascular:     Rate and Rhythm: Normal rate and regular rhythm.     Pulses: Normal pulses.     Heart sounds: Normal heart sounds.  Pulmonary:     Effort: Pulmonary effort is normal.     Breath sounds: Normal breath sounds.  Neurological:     General: No focal deficit present.     Mental Status: She is oriented to person, place, and time.           Assessment & Plan:  Migraines. Given a Toradol shot. Written out of work today until 08-07-18. Try Topamax 25 mg daily for prophylaxis.  Gershon CraneStephen Fry, MD

## 2018-07-15 ENCOUNTER — Telehealth: Payer: Self-pay | Admitting: Family Medicine

## 2018-07-15 NOTE — Telephone Encounter (Signed)
Pt would like to see if you could type her a note for return to work from the date that she was in the office on 07/04/18 to 07/11/18.  And also to have a additional note for today for coming by here.

## 2018-07-15 NOTE — Telephone Encounter (Signed)
Please write her a new note

## 2018-07-15 NOTE — Telephone Encounter (Signed)
Pt came into the office today and stated that she misplaced the note that was given to her by Dr. Clent Ridges.  She was taken out of work from 12/30- 12/3 but was not able to return to work on 12/3 so she ended up going back on 12/6.  She is needing a new note and a note for today for when she came by here.   Dr. Clent Ridges please advise. Thanks

## 2018-07-15 NOTE — Telephone Encounter (Signed)
Called and spoke with pt and she is aware of letter that has been re-written for her and she will come by Monday morning to pick this up.

## 2018-08-08 ENCOUNTER — Other Ambulatory Visit: Payer: Self-pay | Admitting: Family Medicine

## 2018-08-08 ENCOUNTER — Telehealth: Payer: Self-pay | Admitting: Family Medicine

## 2018-08-08 NOTE — Telephone Encounter (Signed)
Dr. Clent Ridges please advise on work note that pt would like to pick up.  Thanks

## 2018-08-08 NOTE — Telephone Encounter (Signed)
Copied from CRM [id number]. Topic: Quick Communication - Rx Refill/Question >> Aug 08, 2018  3:18 PM Angela Nevin wrote: Medication: atorvastatin (LIPITOR) 20 MG tablet   Patient is requesting a refill of this medication.   Preferred Pharmacy (with phone number or street name):[facility] [phone] - [place], [place] - [address] AT [facility] [address]  [phone] (Phone) [phone] (Fax)

## 2018-08-08 NOTE — Telephone Encounter (Signed)
Requested medication (s) are due for refill today: yes  Requested medication (s) are on the active medication list: yes  Last refill:  03/16/18 for 90 tabs  Future visit scheduled: no  Notes to clinic:  Ant lipid - statins failed  Requested Prescriptions  Pending Prescriptions Disp Refills   atorvastatin (LIPITOR) 20 MG tablet 90 tablet 0    Sig: Take 1 tablet (20 mg total) by mouth daily.     Cardiovascular:  Antilipid - Statins Failed - 08/08/2018  3:58 PM      Failed - Total Cholesterol in normal range and within 360 days    Cholesterol  Date Value Ref Range Status  04/12/2017 303 (H) 0 - 200 mg/dL Final    Comment:    ATP III Classification       Desirable:  < 200 mg/dL               Borderline High:  200 - 239 mg/dL          High:  > = 953 mg/dL         Failed - LDL in normal range and within 360 days    LDL Cholesterol  Date Value Ref Range Status  12/08/2016 169  Final         Failed - HDL in normal range and within 360 days    HDL  Date Value Ref Range Status  04/12/2017 43.40 >39.00 mg/dL Final         Failed - Triglycerides in normal range and within 360 days    Triglycerides  Date Value Ref Range Status  04/12/2017 255.0 (H) 0.0 - 149.0 mg/dL Final    Comment:    Normal:  <150 mg/dLBorderline High:  150 - 199 mg/dL         Passed - Patient is not pregnant      Passed - Valid encounter within last 12 months    Recent Outpatient Visits          1 month ago Migraine with aura and with status migrainosus, not intractable   [facility] at [employer], Tera Mater, MD   2 months ago Acute sinusitis, recurrence not specified, unspecified location   [employer] [facility], Tera Mater, MD   4 months ago Migraine with aura and with status migrainosus, not intractable   [facility] officer at [employer], Tera Mater, MD   5 months ago Neck pain   [facility] officer at [employer], Tera Mater, MD   8 months ago Right wrist pain   [facility]s [facility] at [employer], Tera Mater, MD

## 2018-08-08 NOTE — Telephone Encounter (Signed)
Copied from CRM [id number]. Topic: Quick Communication - See Telephone Encounter >> Aug 08, 2018  3:20 PM Angela NevinWilliams, Candice N wrote: CRM for notification. See Telephone encounter for: 08/08/18.  Patient called to inquire if she could pick up a note for work as she missed 01/31 and today 2/3 because of a migraine. Patient states that Dr. Clent RidgesFry is aware that she has a history of migraines and would like to pick up note today, if possible. Please advise.

## 2018-08-09 MED ORDER — ATORVASTATIN CALCIUM 20 MG PO TABS: 20.0000 mg | ORAL_TABLET | Freq: Every day | ORAL | 0 refills | Status: DC

## 2018-08-11 NOTE — Telephone Encounter (Signed)
Please give her medical notes for those days. I also suggest she get FMLA to cover intermittent leave for the migraines so she wouldn't need to get multiple notes

## 2018-08-12 NOTE — Telephone Encounter (Signed)
Called and spoke with pt and she stated that she has to have in 1250 hours at work before she can apply for the FMLA.  The work notes have been completed and left up front for the pt to pick up.

## 2018-08-13 IMAGING — CR DG CHEST 2V
2 series · 2 of 2 positions shown · non-contrast
Comparison: 07/11/2007

CLINICAL DATA: Right arm pain for 1 day.  Hypertension.

EXAM:
CHEST  2 VIEW

[chest pa]
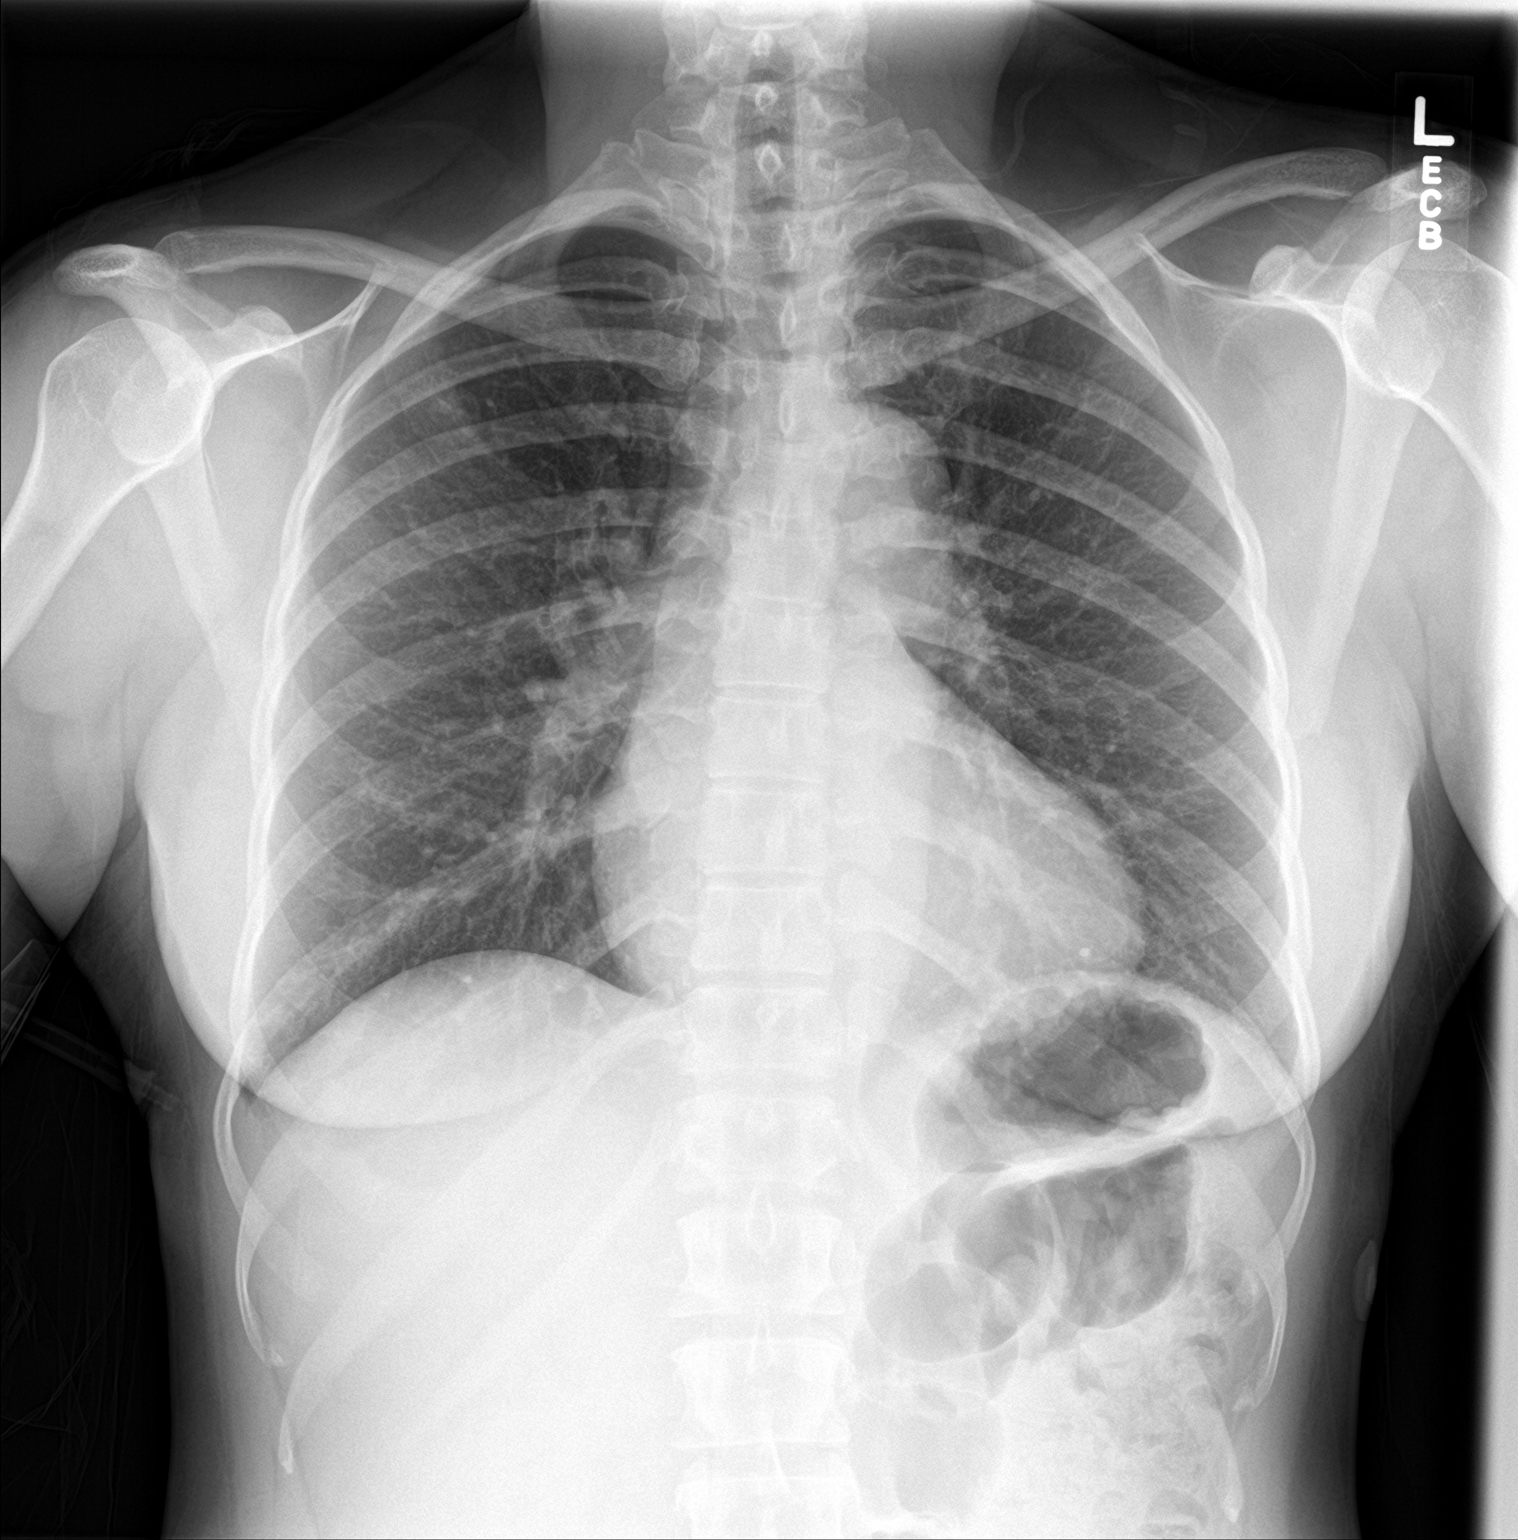

[chest lat]
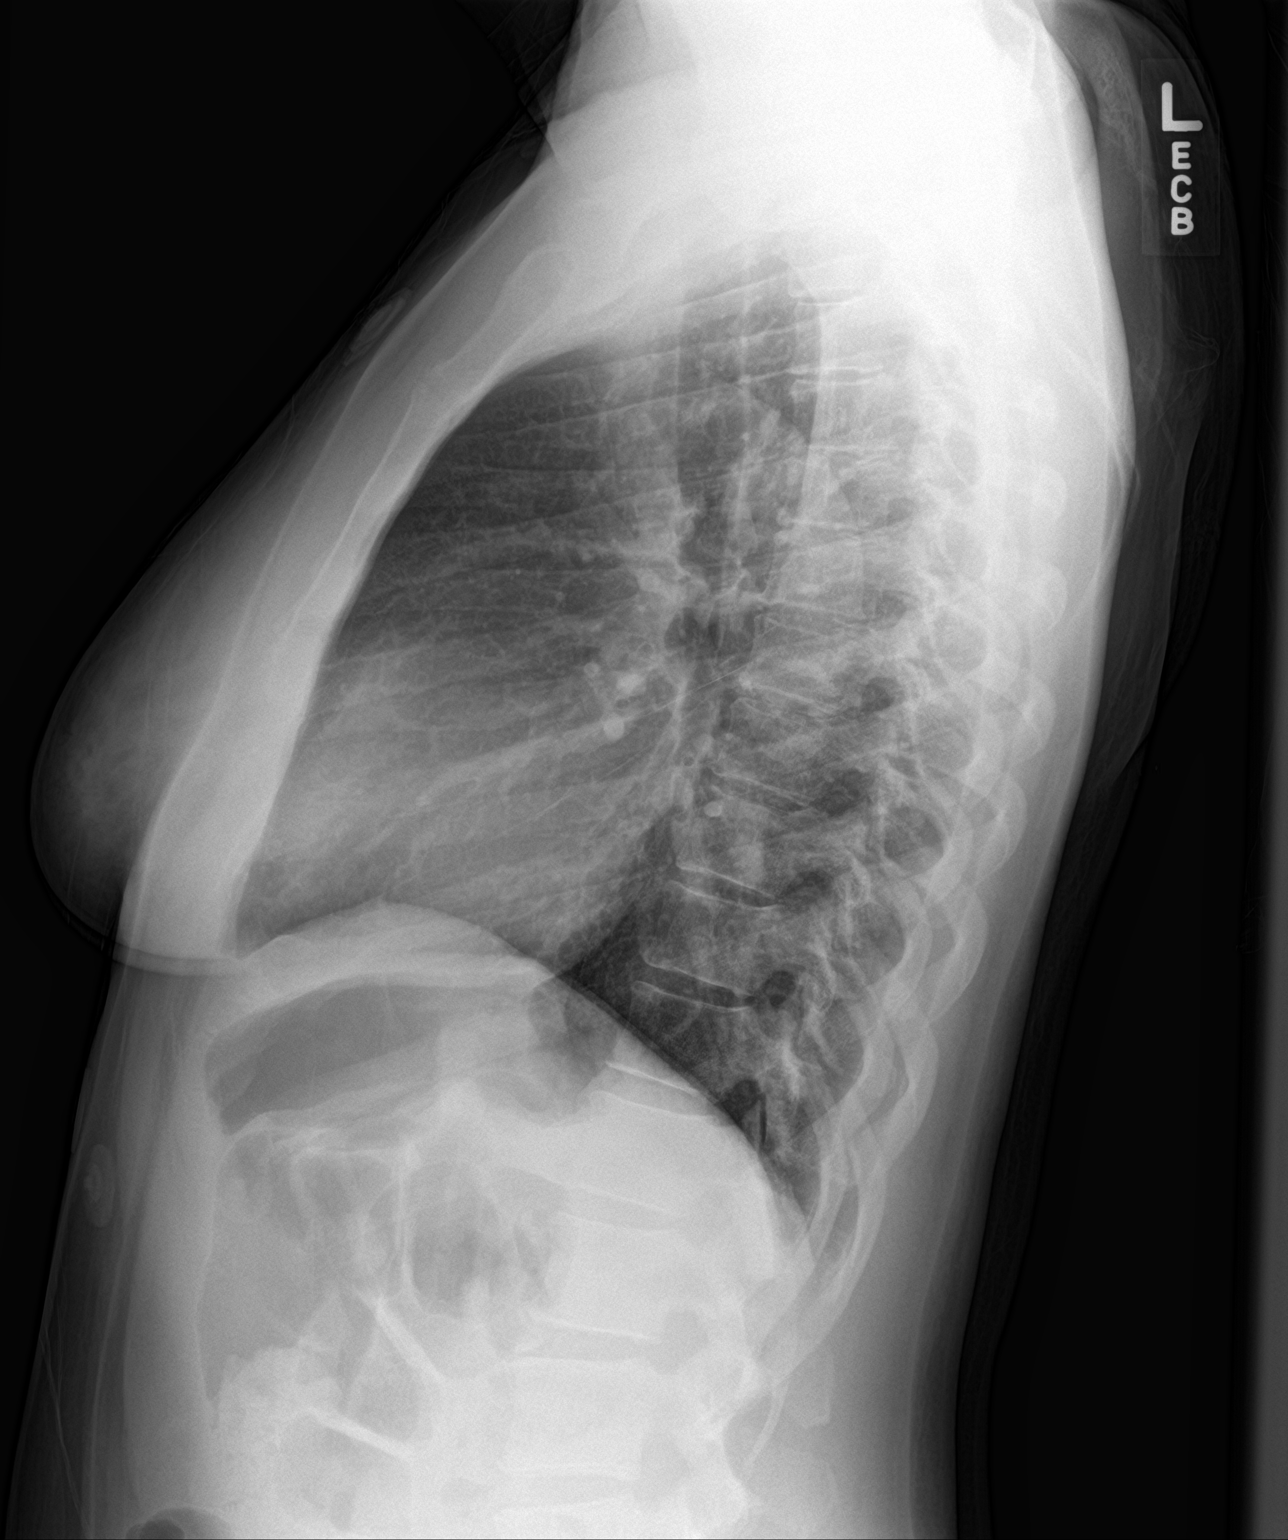

[2 of 2 positions shown; findings below may reference images not displayed]

FINDINGS: The heart size and mediastinal contours are within normal limits.
Both lungs are clear. The visualized skeletal structures are
unremarkable.
IMPRESSION: No active cardiopulmonary disease.

## 2018-09-09 ENCOUNTER — Other Ambulatory Visit: Payer: Self-pay | Admitting: Family Medicine

## 2018-09-20 ENCOUNTER — Ambulatory Visit: Admitting: Family Medicine

## 2018-09-23 ENCOUNTER — Ambulatory Visit: Admitting: Family Medicine

## 2018-09-23 ENCOUNTER — Ambulatory Visit (INDEPENDENT_AMBULATORY_CARE_PROVIDER_SITE_OTHER): Admitting: Family Medicine

## 2018-09-23 ENCOUNTER — Encounter: Payer: Self-pay | Admitting: Family Medicine

## 2018-09-23 ENCOUNTER — Other Ambulatory Visit: Payer: Self-pay

## 2018-09-23 VITALS — BP 106/60 | HR 78 | Temp 98.6°F

## 2018-09-23 DIAGNOSIS — J019 Acute sinusitis, unspecified: Secondary | ICD-10-CM | POA: Diagnosis not present

## 2018-09-23 MED ORDER — AZITHROMYCIN 250 MG PO TABS: ORAL_TABLET | ORAL | 0 refills | Status: DC

## 2018-09-25 ENCOUNTER — Encounter: Payer: Self-pay | Admitting: Family Medicine

## 2018-09-25 NOTE — Progress Notes (Signed)
   Subjective:    Patient ID: Brenda Barrett, female    DOB: [birth year], 48 y.o.   MRN: [id number]  HPI Here for 3 days of sinus pressure, PND, blowing yellow mucus from the nose and a dry cough. No fever or body aches.   Review of Systems  Constitutional: Negative.   HENT: Positive for congestion, postnasal drip, sinus pressure and sore throat.   Eyes: Negative.   Respiratory: Positive for cough.        Objective:   Physical Exam Constitutional:      Appearance: Normal appearance. She is not ill-appearing.  HENT:     Right Ear: Tympanic membrane and ear canal normal.     Left Ear: Tympanic membrane and ear canal normal.     Nose: Nose normal.     Mouth/Throat:     Pharynx: Oropharynx is clear.  Eyes:     Conjunctiva/sclera: Conjunctivae normal.  Pulmonary:     Effort: Pulmonary effort is normal. No respiratory distress.     Breath sounds: Normal breath sounds. No stridor. No wheezing, rhonchi or rales.  Lymphadenopathy:     Cervical: No cervical adenopathy.  Neurological:     Mental Status: She is alert.           Assessment & Plan:  Sinusitis, treat with a Zpack.  Gershon Crane, MD

## 2018-09-30 ENCOUNTER — Telehealth: Payer: Self-pay | Admitting: *Deleted

## 2018-09-30 NOTE — Telephone Encounter (Signed)
Copied from CRM [id number]. Topic: General - Other >> Sep 30, 2018 12:18 PM Leafy Ro wrote: Reason for CRM: pt saw dr fry on 09-23-2018 and needs a note from 09-20-2018 and  return to work on 10-03-2018 with out restriction. Please put note in her my chart account

## 2018-10-03 NOTE — Telephone Encounter (Signed)
Note has been completed.  I have left this up front for the pt to pick up.  I attempted to call her to make her aware but the VM is full.

## 2018-10-03 NOTE — Telephone Encounter (Signed)
Please generate such a note

## 2018-10-04 NOTE — Telephone Encounter (Signed)
PEC VM from pt calling back. Tried to call again to notify pt of letter being ready for pick up. VM is full.

## 2018-10-05 ENCOUNTER — Other Ambulatory Visit: Payer: Self-pay

## 2018-10-05 ENCOUNTER — Encounter: Payer: Self-pay | Admitting: Family Medicine

## 2018-10-05 ENCOUNTER — Ambulatory Visit (INDEPENDENT_AMBULATORY_CARE_PROVIDER_SITE_OTHER): Admitting: Family Medicine

## 2018-10-05 DIAGNOSIS — J019 Acute sinusitis, unspecified: Secondary | ICD-10-CM | POA: Diagnosis not present

## 2018-10-05 DIAGNOSIS — Z209 Contact with and (suspected) exposure to unspecified communicable disease: Secondary | ICD-10-CM | POA: Diagnosis not present

## 2018-10-05 NOTE — Progress Notes (Signed)
   Subjective:    Patient ID: Brenda Barrett, female    DOB: 08/20/1970, 48 y.o.   MRN: [id number]  HPI Virtual Visit via Telephone Note  I connected with the patient on 10/05/18 at  1:30 PM EDT by Webex and verified that I am speaking with the correct person using two identifiers. We had technical difficulties with both video and audio, so we spoke over the telephone.    I discussed the limitations, risks, security and privacy concerns of performing an evaluation and management service by telephone and the availability of in person appointments. I also discussed with the patient that there may be a patient responsible charge related to this service. The patient expressed understanding and agreed to proceed.  Location patient: home Location provider: work or home office Participants present for the call: patient, provider Patient did not have a visit in the prior 7 days to address this/these issue(s).   History of Present Illness: She asks for advice about her work situation. She was seen her on 10-03-18 for a sinusitis, and she is taking a Zpack for this. 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 Because of this all the office workers were told to check with their PCP about what to do. Jaydynn feels a little better from her sinusitis, but she is still congested and blowing yellow mucus from the nose. No SOB or fever.    Observations/Objective: Patient sounds cheerful and well on the phone. I do not appreciate any SOB. Speech and thought processing are grossly intact. Patient reported vitals:  Assessment and Plan: As for the sinusitis, I advised her to finish up the Zpack as we had planned. Since she has been exposed to a coworker who is possibly infected with the Covid-19 virus, I advised her to go home and self quarantine for the next 14 days. She agreed and  we will supply her with a letter for her employers.   Follow Up Instructions:     99441 5-10 99442 11-20 9443 21-30 I did not refer this patient for an OV in the next 24 hours for this/these issue(s).  I discussed the assessment and treatment plan with the patient. The patient was provided an opportunity to ask questions and all were answered. The patient agreed with the plan and demonstrated an understanding of the instructions.   The patient was advised to call back or seek an in-person evaluation if the symptoms worsen or if the condition fails to improve as anticipated.  I provided 17 minutes of non-face-to-face time during this encounter.   Gershon Crane, MD    Review of Systems     Objective:   Physical Exam        Assessment & Plan:

## 2018-10-06 ENCOUNTER — Telehealth: Payer: Self-pay | Admitting: *Deleted

## 2018-10-06 NOTE — Telephone Encounter (Signed)
Copied from CRM [id number]. Topic: General - Inquiry >> Oct 06, 2018  2:51 PM Terisa Starr wrote: Reason for CRM: patient states her work will not accept the note that Dr Clent Ridges gave her. She said that her employer is stating that the part about her co-worker being sent home due to being tested for COVID19 needs to be taken out because that is not necessarily true. Please advise.

## 2018-10-07 NOTE — Telephone Encounter (Signed)
Has been exposed to someone that may have had covid 19.    Called and spoke with the pt and she stated that the letter was ok before but the following statement would need to be in the letter:  Brenda Barrett has been exposed to someone that may have had covid 19.   Pt asked that the part:  of a worker who works closely with the pt was recently tested for covid 19 virus and was sent home be removed from the previous letter and just the small part above entered.  Pt stated that she can print this off of mychart when it is done.

## 2018-10-07 NOTE — Telephone Encounter (Signed)
Dr. Fry please advise. Thanks  

## 2018-10-07 NOTE — Telephone Encounter (Signed)
Ask her what exactly the note is supposed to say?

## 2018-10-10 NOTE — Telephone Encounter (Signed)
The corrected form of the letter is ready

## 2018-10-31 ENCOUNTER — Telehealth: Payer: Self-pay | Admitting: *Deleted

## 2018-10-31 NOTE — Telephone Encounter (Signed)
Copied from CRM [id number]. Topic: General - Other >> Oct 31, 2018  2:34 PM Mcneil, Ja-Kwan wrote: Reason for CRM: Pt stated she would like to speak with Dr. Clent Ridges about increasing her dosage for sertraline (ZOLOFT) 100 MG tablet. Pt requests call back. Cb# [phone]

## 2018-10-31 NOTE — Telephone Encounter (Signed)
Dr. Clent Ridges please advise on increasing the zoloft per pts request.  Thanks

## 2018-11-01 MED ORDER — SERTRALINE HCL 50 MG PO TABS: 50.0000 mg | ORAL_TABLET | Freq: Every day | ORAL | 0 refills | Status: DC

## 2018-11-01 NOTE — Telephone Encounter (Signed)
Called and spoke with pt and she is aware of rx sent to the pharmacy    One tot he local pharmacy and one to the mail order pharmacy.

## 2018-11-01 NOTE — Addendum Note (Signed)
Addended by: Marcellus Scott on: 11/01/2018 08:55 AM   Modules accepted: Orders

## 2018-11-01 NOTE — Telephone Encounter (Signed)
I agree, she can increase the total dose to 150 mg daily. Stay on the 100 mg tablets once a day, but add a 50 mg tablet daily to them. Call in 50 mg #30 with 3 rf

## 2018-11-04 ENCOUNTER — Other Ambulatory Visit: Payer: Self-pay | Admitting: Family Medicine

## 2018-11-08 ENCOUNTER — Encounter: Payer: Self-pay | Admitting: Family Medicine

## 2018-11-08 ENCOUNTER — Other Ambulatory Visit: Payer: Self-pay

## 2018-11-08 ENCOUNTER — Ambulatory Visit (INDEPENDENT_AMBULATORY_CARE_PROVIDER_SITE_OTHER): Admitting: Family Medicine

## 2018-11-08 DIAGNOSIS — H409 Unspecified glaucoma: Secondary | ICD-10-CM | POA: Diagnosis not present

## 2018-11-08 DIAGNOSIS — G43101 Migraine with aura, not intractable, with status migrainosus: Secondary | ICD-10-CM

## 2018-11-08 NOTE — Progress Notes (Signed)
Subjective:    Patient ID: Brenda Barrett, female    DOB: 1970/11/01, 48 y.o.   MRN: [id number]  HPI Here to ask advice about filing for disability. She has serious eye issues and she sees Dr. Chalmers Guestoy Whitaker for ophthalmology care. She has had glaucoma for years and she uses several drops every day for this. This has been progressive over the past few years, and now she also has 2 other eye issues. She says one involves her corneas and the other is astigmatism. She says she has very poor vision out of one eye and less than average vision out of the other eye, and this affects her more with looking at things close up such as reading a book or looking on a computer screen. Because of the constant strain on her eyes, this generates frequent headaches, and most of these headaches lead to migraines. These require taking a Sumatriptan or two and lying down in a dark room for them to resolve. The combination of vision problems and frequent headaches have caused her to miss a lot of work, and she now feels she cannot work on her current job at [employer] in any capacity. She has spoken to Dr. Harlon FlorWhitaker about letters to certify her disability, and he said this needs to start with her PCP.  Virtual Visit via Video Note  I connected with the patient on 11/08/18 at  1:45 PM EDT by a video enabled telemedicine application and verified that I am speaking with the correct person using two identifiers.  Location patient: home Location provider:work or home office Persons participating in the virtual visit: patient, provider  I discussed the limitations of evaluation and management by telemedicine and the availability of in person appointments. The patient expressed understanding and agreed to proceed.   HPI: See my notes above.    ROS: See pertinent positives and negatives per HPI.  Past Medical History:  Diagnosis Date  . Depression   . GERD (gastroesophageal reflux disease)   . Hyperlipidemia    . Migraines   . Seasonal allergies     Past Surgical History:  Procedure Laterality Date  . TUBAL LIGATION      Family History  Problem Relation Age of Onset  . Diabetes Unknown        family hx  . Hyperlipidemia Unknown        family hx  . Stroke Unknown        family hx     Current Outpatient Medications:  .  ALPRAZolam (XANAX) 1 MG tablet, Take 1 tablet (1 mg total) by mouth 3 (three) times daily as needed., Disp: 270 tablet, Rfl: 1 .  amLODipine (NORVASC) 5 MG tablet, TAKE 1 TABLET (5 MG TOTAL) BY MOUTH DAILY., Disp: 90 tablet, Rfl: 3 .  aspirin-acetaminophen-caffeine (EXCEDRIN MIGRAINE) 250-250-65 MG tablet, Take 1 tablet by mouth every 6 (six) hours as needed for headache., Disp: , Rfl:  .  atorvastatin (LIPITOR) 20 MG tablet, TAKE 1 TABLET(20 MG) BY MOUTH DAILY, Disp: 90 tablet, Rfl: 0 .  azithromycin (ZITHROMAX Z-PAK) 250 MG tablet, As directed, Disp: 6 each, Rfl: 0 .  Bimatoprost (LUMIGAN OP), Place 1 drop into both eyes daily. , Disp: , Rfl:  .  buPROPion (WELLBUTRIN XL) 300 MG 24 hr tablet, TAKE 1 TABLET BY MOUTH ONCE DAILY, Disp: 90 tablet, Rfl: 3 .  cholecalciferol (VITAMIN D) 1000 units tablet, Take 1,000 Units by mouth daily., Disp: , Rfl:  .  COMBIGAN 0.2-0.5 %  ophthalmic solution, Place 1 drop into both eyes daily. , Disp: , Rfl:  .  diclofenac (VOLTAREN) 75 MG EC tablet, Take 1 tablet (75 mg total) by mouth 2 (two) times daily., Disp: 60 tablet, Rfl: 2 .  hydrochlorothiazide (HYDRODIURIL) 25 MG tablet, TAKE 1 TABLET DAILY, Disp: 90 tablet, Rfl: 3 .  HYDROcodone-homatropine (HYDROMET) 5-1.5 MG/5ML syrup, Take 5 mLs by mouth every 4 (four) hours as needed., Disp: 240 mL, Rfl: 0 .  levothyroxine (SYNTHROID, LEVOTHROID) 25 MCG tablet, TAKE 1 TABLET BY MOUTH EVERY DAY BEFORE BREAKFAST, Disp: 90 tablet, Rfl: 0 .  MAGNESIUM PO, Take by mouth daily., Disp: , Rfl:  .  methocarbamol (ROBAXIN-750) 750 MG tablet, Take 1 tablet (750 mg total) by mouth every 6 (six) hours as  needed for muscle spasms., Disp: 60 tablet, Rfl: 5 .  potassium chloride (KLOR-CON 10) 10 MEQ tablet, Take 1 tablet (10 mEq total) by mouth daily., Disp: 30 tablet, Rfl: 0 .  sertraline (ZOLOFT) 100 MG tablet, Take 1 tablet (100 mg total) by mouth daily., Disp: 90 tablet, Rfl: 3 .  sertraline (ZOLOFT) 50 MG tablet, Take 1 tablet (50 mg total) by mouth daily., Disp: 90 tablet, Rfl: 0 .  SUMAtriptan (IMITREX) 100 MG tablet, Take 1 tablet (100 mg total) by mouth every 2 (two) hours as needed for migraine. May repeat in 2 hours if headache persists or recurs., Disp: 30 tablet, Rfl: 3 .  topiramate (TOPAMAX) 25 MG tablet, Take 1 tablet (25 mg total) by mouth daily., Disp: 30 tablet, Rfl: 2  EXAM:  VITALS per patient if applicable:  GENERAL: alert, oriented, appears well and in no acute distress  HEENT: atraumatic, conjunttiva clear, no obvious abnormalities on inspection of external nose and ears  NECK: normal movements of the head and neck  LUNGS: on inspection no signs of respiratory distress, breathing rate appears normal, no obvious gross SOB, gasping or wheezing  CV: no obvious cyanosis  MS: moves all visible extremities without noticeable abnormality  PSYCH/NEURO: pleasant and cooperative, no obvious depression or anxiety, speech and thought processing grossly intact  ASSESSMENT AND PLAN: She has chronic eye problems including glaucoma which cause severe vision problems, and these lead to debilitating headaches. I do feel she would qualify for disability. I asked her to provide all medical records from Dr. Hosie Poisson office so I can learn about her eye issues. We will write her out of work indefinitely starting tomorrow 11-09-18.  Gershon Crane, MD  Discussed the following assessment and plan:  Glaucoma of both eyes, unspecified glaucoma type     I discussed the assessment and treatment plan with the patient. The patient was provided an opportunity to ask questions and all were  answered. The patient agreed with the plan and demonstrated an understanding of the instructions.   The patient was advised to call back or seek an in-person evaluation if the symptoms worsen or if the condition fails to improve as anticipated.    Review of Systems     Objective:   Physical Exam        Assessment & Plan:

## 2018-11-29 ENCOUNTER — Other Ambulatory Visit: Payer: Self-pay

## 2018-11-29 ENCOUNTER — Ambulatory Visit (INDEPENDENT_AMBULATORY_CARE_PROVIDER_SITE_OTHER): Admitting: Family Medicine

## 2018-11-29 ENCOUNTER — Encounter: Payer: Self-pay | Admitting: Family Medicine

## 2018-11-29 DIAGNOSIS — H409 Unspecified glaucoma: Secondary | ICD-10-CM | POA: Diagnosis not present

## 2018-11-29 DIAGNOSIS — G43101 Migraine with aura, not intractable, with status migrainosus: Secondary | ICD-10-CM | POA: Diagnosis not present

## 2018-11-29 NOTE — Progress Notes (Signed)
Subjective:    Patient ID: Brenda Barrett, female    DOB: 20-Aug-1970, 48 y.o.   MRN: [id number]  HPI Virtual Visit via Video Note  I connected with the patient on 11/29/18 at  4:00 PM EDT by a video enabled telemedicine application and verified that I am speaking with the correct person using two identifiers.  Location patient: home Location provider:work or home office Persons participating in the virtual visit: patient, provider  I discussed the limitations of evaluation and management by telemedicine and the availability of in person appointments. The patient expressed understanding and agreed to proceed.   HPI: She is following up on disability due to glaucoma and chronic headaches. She struggles with poor vision and headaches every day and these have made it impossible to work. She sees Dr. Chalmers Guest for ophthalmologic care. I have been waiting to review her medical records from Dr. Hosie Poisson office but these have not arrived yet.    ROS: See pertinent positives and negatives per HPI.  Past Medical History:  Diagnosis Date  . Depression   . GERD (gastroesophageal reflux disease)   . Glaucoma    sees Dr. Arvil Chaco   . Hyperlipidemia   . Migraines   . Seasonal allergies     Past Surgical History:  Procedure Laterality Date  . TUBAL LIGATION      Family History  Problem Relation Age of Onset  . Diabetes Other        family hx  . Hyperlipidemia Other        family hx  . Stroke Other        family hx     Current Outpatient Medications:  .  ALPRAZolam (XANAX) 1 MG tablet, Take 1 tablet (1 mg total) by mouth 3 (three) times daily as needed., Disp: 270 tablet, Rfl: 1 .  amLODipine (NORVASC) 5 MG tablet, TAKE 1 TABLET (5 MG TOTAL) BY MOUTH DAILY., Disp: 90 tablet, Rfl: 3 .  aspirin-acetaminophen-caffeine (EXCEDRIN MIGRAINE) 250-250-65 MG tablet, Take 1 tablet by mouth every 6 (six) hours as needed for headache., Disp: , Rfl:  .  atorvastatin (LIPITOR) 20  MG tablet, TAKE 1 TABLET(20 MG) BY MOUTH DAILY, Disp: 90 tablet, Rfl: 0 .  azithromycin (ZITHROMAX Z-PAK) 250 MG tablet, As directed, Disp: 6 each, Rfl: 0 .  Bimatoprost (LUMIGAN OP), Place 1 drop into both eyes daily. , Disp: , Rfl:  .  buPROPion (WELLBUTRIN XL) 300 MG 24 hr tablet, TAKE 1 TABLET BY MOUTH ONCE DAILY, Disp: 90 tablet, Rfl: 3 .  cholecalciferol (VITAMIN D) 1000 units tablet, Take 1,000 Units by mouth daily., Disp: , Rfl:  .  COMBIGAN 0.2-0.5 % ophthalmic solution, Place 1 drop into both eyes daily. , Disp: , Rfl:  .  diclofenac (VOLTAREN) 75 MG EC tablet, Take 1 tablet (75 mg total) by mouth 2 (two) times daily., Disp: 60 tablet, Rfl: 2 .  hydrochlorothiazide (HYDRODIURIL) 25 MG tablet, TAKE 1 TABLET DAILY, Disp: 90 tablet, Rfl: 3 .  HYDROcodone-homatropine (HYDROMET) 5-1.5 MG/5ML syrup, Take 5 mLs by mouth every 4 (four) hours as needed., Disp: 240 mL, Rfl: 0 .  levothyroxine (SYNTHROID, LEVOTHROID) 25 MCG tablet, TAKE 1 TABLET BY MOUTH EVERY DAY BEFORE BREAKFAST, Disp: 90 tablet, Rfl: 0 .  MAGNESIUM PO, Take by mouth daily., Disp: , Rfl:  .  methocarbamol (ROBAXIN-750) 750 MG tablet, Take 1 tablet (750 mg total) by mouth every 6 (six) hours as needed for muscle spasms., Disp: 60 tablet, Rfl: 5 .  potassium chloride (KLOR-CON 10) 10 MEQ tablet, Take 1 tablet (10 mEq total) by mouth daily., Disp: 30 tablet, Rfl: 0 .  sertraline (ZOLOFT) 100 MG tablet, Take 1 tablet (100 mg total) by mouth daily., Disp: 90 tablet, Rfl: 3 .  sertraline (ZOLOFT) 50 MG tablet, Take 1 tablet (50 mg total) by mouth daily., Disp: 90 tablet, Rfl: 0 .  SUMAtriptan (IMITREX) 100 MG tablet, Take 1 tablet (100 mg total) by mouth every 2 (two) hours as needed for migraine. May repeat in 2 hours if headache persists or recurs., Disp: 30 tablet, Rfl: 3 .  topiramate (TOPAMAX) 25 MG tablet, Take 1 tablet (25 mg total) by mouth daily., Disp: 30 tablet, Rfl: 2  EXAM:  VITALS per patient if applicable:  GENERAL:  alert, oriented, appears well and in no acute distress  HEENT: atraumatic, conjunttiva clear, no obvious abnormalities on inspection of external nose and ears  NECK: normal movements of the head and neck  LUNGS: on inspection no signs of respiratory distress, breathing rate appears normal, no obvious gross SOB, gasping or wheezing  CV: no obvious cyanosis  MS: moves all visible extremities without noticeable abnormality  PSYCH/NEURO: pleasant and cooperative, no obvious depression or anxiety, speech and thought processing grossly intact  ASSESSMENT AND PLAN: She has chronic daily headaches which stem, in part, from glaucoma. We will attempt to have her records sent over to us from Dr. Hosie PoissonWhitaker's office. We have written her out of work indefinitely from 11-09-18 forward, and she may apply for full disability at some point.  Gershon CraneStephen Kinzey Sheriff, MD  Discussed the following assessment and plan:  Migraine with aura and with status migrainosus, not intractable  Glaucoma of both eyes, unspecified glaucoma type     I discussed the assessment and treatment plan with the patient. The patient was provided an opportunity to ask questions and all were answered. The patient agreed with the plan and demonstrated an understanding of the instructions.   The patient was advised to call back or seek an in-person evaluation if the symptoms worsen or if the condition fails to improve as anticipated.     Review of Systems     Objective:   Physical Exam        Assessment & Plan:

## 2018-12-19 DIAGNOSIS — Z0279 Encounter for issue of other medical certificate: Secondary | ICD-10-CM

## 2019-03-15 ENCOUNTER — Other Ambulatory Visit: Payer: Self-pay | Admitting: Family Medicine

## 2019-05-12 ENCOUNTER — Other Ambulatory Visit: Payer: Self-pay | Admitting: Family Medicine

## 2019-05-12 NOTE — Telephone Encounter (Signed)
sertraline (ZOLOFT) 50 MG tablet     Patient is requesting refill.    Pharmacy:  [facility] [address], [place] - [place] [address] [phone] (Phone) [phone] (Fax)

## 2019-05-15 ENCOUNTER — Other Ambulatory Visit: Payer: Self-pay | Admitting: Family Medicine

## 2019-05-15 NOTE — Telephone Encounter (Signed)
Patient is calling again to check the status of her refill request.  Please advise and let patient know when she can get her refill

## 2019-05-15 NOTE — Telephone Encounter (Signed)
Please note below

## 2019-07-07 HISTORY — PX: EYE SURGERY: SHX253

## 2019-08-11 ENCOUNTER — Telehealth: Payer: Self-pay | Admitting: Family Medicine

## 2019-08-11 MED ORDER — BUPROPION HCL ER (XL) 300 MG PO TB24: 300.0000 mg | ORAL_TABLET | Freq: Every day | ORAL | 0 refills | Status: DC

## 2019-08-11 NOTE — Telephone Encounter (Signed)
Medication refill: Wellbutrin  Pharmacy: [facility] [address] Fax: [phone]

## 2019-08-11 NOTE — Telephone Encounter (Signed)
Rx sent in. Patient is aware.  

## 2019-09-04 HISTORY — PX: EYE SURGERY: SHX253

## 2019-09-27 ENCOUNTER — Telehealth: Payer: Self-pay | Admitting: Family Medicine

## 2019-09-27 NOTE — Telephone Encounter (Signed)
Pt is requesting a refill on Zoloft 50 mg and Zoloft 100 mg sent to [facility] on [address]([place]). Thanks

## 2019-09-28 MED ORDER — SERTRALINE HCL 100 MG PO TABS: 100.0000 mg | ORAL_TABLET | Freq: Every day | ORAL | 3 refills | Status: DC

## 2019-09-28 MED ORDER — SERTRALINE HCL 50 MG PO TABS: ORAL_TABLET | ORAL | 3 refills | Status: DC

## 2019-09-28 NOTE — Telephone Encounter (Signed)
Message Routed to PCP  for approval. 

## 2019-09-28 NOTE — Telephone Encounter (Signed)
Done

## 2020-03-20 ENCOUNTER — Other Ambulatory Visit: Payer: Self-pay | Admitting: Family Medicine

## 2020-03-21 ENCOUNTER — Other Ambulatory Visit: Payer: Self-pay | Admitting: Family Medicine

## 2020-03-21 NOTE — Telephone Encounter (Signed)
Patient called and advised of the need for a physical with lab work with Dr. Clent Ridges, she agreed. Appointment scheduled for 04/02/20 at 1500. Advised a 30 day courtesy refill will be sent.

## 2020-04-02 ENCOUNTER — Other Ambulatory Visit: Payer: Self-pay

## 2020-04-02 ENCOUNTER — Encounter: Payer: Self-pay | Admitting: Family Medicine

## 2020-04-02 ENCOUNTER — Ambulatory Visit (INDEPENDENT_AMBULATORY_CARE_PROVIDER_SITE_OTHER): Admitting: Family Medicine

## 2020-04-02 VITALS — BP 140/80 | HR 75 | Temp 98.5°F | Ht 66.5 in | Wt 175.2 lb

## 2020-04-02 DIAGNOSIS — E042 Nontoxic multinodular goiter: Secondary | ICD-10-CM

## 2020-04-02 DIAGNOSIS — M25562 Pain in left knee: Secondary | ICD-10-CM

## 2020-04-02 DIAGNOSIS — Z0000 Encounter for general adult medical examination without abnormal findings: Secondary | ICD-10-CM | POA: Diagnosis not present

## 2020-04-02 DIAGNOSIS — Z23 Encounter for immunization: Secondary | ICD-10-CM

## 2020-04-02 DIAGNOSIS — E039 Hypothyroidism, unspecified: Secondary | ICD-10-CM | POA: Diagnosis not present

## 2020-04-02 MED ORDER — LEVOTHYROXINE SODIUM 25 MCG PO TABS
25.0000 ug | ORAL_TABLET | Freq: Every day | ORAL | 3 refills | Status: DC
Start: 2020-04-02 — End: 2021-02-04

## 2020-04-02 MED ORDER — SERTRALINE HCL 100 MG PO TABS
200.0000 mg | ORAL_TABLET | Freq: Every day | ORAL | 3 refills | Status: DC
Start: 2020-04-02 — End: 2020-10-15

## 2020-04-02 MED ORDER — SUMATRIPTAN SUCCINATE 100 MG PO TABS: 100.0000 mg | ORAL_TABLET | ORAL | 3 refills | Status: DC | PRN

## 2020-04-02 MED ORDER — DICLOFENAC SODIUM 75 MG PO TBEC
75.0000 mg | DELAYED_RELEASE_TABLET | Freq: Two times a day (BID) | ORAL | 3 refills | Status: DC
Start: 2020-04-02 — End: 2021-02-04

## 2020-04-02 MED ORDER — TOPIRAMATE 25 MG PO TABS: 25.0000 mg | ORAL_TABLET | Freq: Every day | ORAL | 3 refills | Status: AC

## 2020-04-02 MED ORDER — POTASSIUM CHLORIDE ER 10 MEQ PO TBCR
10.0000 meq | EXTENDED_RELEASE_TABLET | Freq: Every day | ORAL | 3 refills | Status: DC
Start: 2020-04-02 — End: 2021-02-04

## 2020-04-02 MED ORDER — HYDROCHLOROTHIAZIDE 25 MG PO TABS
25.0000 mg | ORAL_TABLET | Freq: Every day | ORAL | 3 refills | Status: DC
Start: 2020-04-02 — End: 2021-02-04

## 2020-04-02 MED ORDER — BUPROPION HCL ER (XL) 300 MG PO TB24: 300.0000 mg | ORAL_TABLET | Freq: Every day | ORAL | 3 refills | Status: DC

## 2020-04-02 NOTE — Addendum Note (Signed)
Addended by: Wilford Corner on: 04/02/2020 05:09 PM   Modules accepted: Orders
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
## 2020-04-03 ENCOUNTER — Other Ambulatory Visit

## 2020-04-03 DIAGNOSIS — Z0000 Encounter for general adult medical examination without abnormal findings: Secondary | ICD-10-CM

## 2020-04-03 DIAGNOSIS — E039 Hypothyroidism, unspecified: Secondary | ICD-10-CM

## 2020-04-04 LAB — CBC WITH DIFFERENTIAL/PLATELET
Absolute Monocytes: 318 cells/uL (ref 200–950)
Basophils Absolute: 42 cells/uL (ref 0–200)
Basophils Relative: 0.8 %
Eosinophils Absolute: 207 cells/uL (ref 15–500)
Eosinophils Relative: 3.9 %
HCT: 34.3 % — ABNORMAL LOW (ref 35.0–45.0)
Hemoglobin: 11.4 g/dL — ABNORMAL LOW (ref 11.7–15.5)
Lymphs Abs: 1601 cells/uL (ref 850–3900)
MCH: 28.6 pg (ref 27.0–33.0)
MCHC: 33.2 g/dL (ref 32.0–36.0)
MCV: 86 fL (ref 80.0–100.0)
MPV: 11.9 fL (ref 7.5–12.5)
Monocytes Relative: 6 %
Neutro Abs: 3132 cells/uL (ref 1500–7800)
Neutrophils Relative %: 59.1 %
Platelets: 254 10*3/uL (ref 140–400)
RBC: 3.99 10*6/uL (ref 3.80–5.10)
RDW: 12.6 % (ref 11.0–15.0)
Total Lymphocyte: 30.2 %
WBC: 5.3 10*3/uL (ref 3.8–10.8)

## 2020-04-04 LAB — HEPATIC FUNCTION PANEL
AG Ratio: 1.2 (calc) (ref 1.0–2.5)
ALT: 14 U/L (ref 6–29)
AST: 20 U/L (ref 10–35)
Albumin: 4.1 g/dL (ref 3.6–5.1)
Alkaline phosphatase (APISO): 132 U/L (ref 37–153)
Bilirubin, Direct: 0.1 mg/dL (ref 0.0–0.2)
Globulin: 3.5 g/dL (calc) (ref 1.9–3.7)
Indirect Bilirubin: 0.5 mg/dL (calc) (ref 0.2–1.2)
Total Bilirubin: 0.6 mg/dL (ref 0.2–1.2)
Total Protein: 7.6 g/dL (ref 6.1–8.1)

## 2020-04-04 LAB — LIPID PANEL
Cholesterol: 210 mg/dL — ABNORMAL HIGH (ref <200)
HDL: 37 mg/dL — ABNORMAL LOW (ref >=50)
LDL Cholesterol (Calc): 118 mg/dL (calc) — ABNORMAL HIGH
Non-HDL Cholesterol (Calc): 173 mg/dL (calc) — ABNORMAL HIGH (ref <130)
Total CHOL/HDL Ratio: 5.7 (calc) — ABNORMAL HIGH (ref <5.0)
Triglycerides: 387 mg/dL — ABNORMAL HIGH (ref <150)

## 2020-04-04 LAB — BASIC METABOLIC PANEL
BUN: 12 mg/dL (ref 7–25)
CO2: 31 mmol/L (ref 20–32)
Calcium: 9.6 mg/dL (ref 8.6–10.4)
Chloride: 100 mmol/L (ref 98–110)
Creat: 0.87 mg/dL (ref 0.50–1.05)
Glucose, Bld: 104 mg/dL — ABNORMAL HIGH (ref 65–99)
Potassium: 3.8 mmol/L (ref 3.5–5.3)
Sodium: 137 mmol/L (ref 135–146)

## 2020-04-04 LAB — TSH: TSH: 0.49 mIU/L

## 2020-04-04 LAB — T3, FREE: T3, Free: 3.1 pg/mL (ref 2.3–4.2)

## 2020-04-04 LAB — T4, FREE: Free T4: 1.3 ng/dL (ref 0.8–1.8)

## 2020-04-16 ENCOUNTER — Other Ambulatory Visit

## 2020-04-18 ENCOUNTER — Other Ambulatory Visit: Payer: Self-pay | Admitting: Family Medicine

## 2020-04-18 NOTE — Telephone Encounter (Signed)
Patient called and advised of the refill request sent from [facility]. She says she is using [facility] now. Rx refused.

## 2020-04-22 ENCOUNTER — Other Ambulatory Visit: Payer: Self-pay

## 2020-04-22 ENCOUNTER — Ambulatory Visit
Admission: RE | Admit: 2020-04-22 | Discharge: 2020-04-22 | Disposition: A | Discharge status: Home / Self Care | Source: Ambulatory Visit | Attending: Family Medicine | Admitting: Family Medicine

## 2020-04-22 DIAGNOSIS — E042 Nontoxic multinodular goiter: Secondary | ICD-10-CM

## 2020-05-14 ENCOUNTER — Encounter: Payer: Self-pay | Admitting: Gastroenterology

## 2020-07-03 ENCOUNTER — Encounter

## 2020-07-03 ENCOUNTER — Telehealth: Payer: Self-pay | Admitting: *Deleted

## 2020-07-03 NOTE — Telephone Encounter (Signed)
Missed pre-visit today.  Rescheduled for 07/11/2020 @ 10:00

## 2020-07-11 ENCOUNTER — Ambulatory Visit: Admitting: *Deleted

## 2020-07-11 ENCOUNTER — Other Ambulatory Visit: Payer: Self-pay

## 2020-07-11 VITALS — Ht 66.5 in | Wt 154.0 lb

## 2020-07-11 DIAGNOSIS — Z1211 Encounter for screening for malignant neoplasm of colon: Secondary | ICD-10-CM

## 2020-07-11 MED ORDER — NA SULFATE-K SULFATE-MG SULF 17.5-3.13-1.6 GM/177ML PO SOLN: 1.0000 | Freq: Once | ORAL | 0 refills | Status: AC

## 2020-07-11 NOTE — Progress Notes (Signed)

## 2020-07-17 ENCOUNTER — Ambulatory Visit (AMBULATORY_SURGERY_CENTER): Admitting: Gastroenterology

## 2020-07-17 ENCOUNTER — Other Ambulatory Visit: Payer: Self-pay

## 2020-07-17 ENCOUNTER — Encounter: Payer: Self-pay | Admitting: Gastroenterology

## 2020-07-17 VITALS — BP 161/98 | HR 55 | Temp 96.9°F | Resp 14 | Ht 66.0 in | Wt 157.0 lb

## 2020-07-17 DIAGNOSIS — Z1211 Encounter for screening for malignant neoplasm of colon: Secondary | ICD-10-CM | POA: Diagnosis present

## 2020-07-17 HISTORY — PX: COLONOSCOPY: SHX174

## 2020-07-17 MED ORDER — SODIUM CHLORIDE 0.9 % IV SOLN: 500.0000 mL | Freq: Once | INTRAVENOUS | Status: DC

## 2020-07-17 NOTE — Op Note (Signed)
[facility] Patient Name: Brenda Barrett Procedure Date: 07/17/2020 10:40 AM MRN: [id number] Endoscopist: Sherilyn Cooter L. Myrtie Neither , MD Age: 50 Referring MD:  Date of Birth: 24-Sep-1970 Gender: Female Account #: [id number] Procedure:                Colonoscopy Indications:              Screening for colorectal malignant neoplasm, This                            is the patient's first colonoscopy Medicines:                Monitored Anesthesia Care Procedure:                Pre-Anesthesia Assessment:                           - Prior to the procedure, a History and Physical                            was performed, and patient medications and                            allergies were reviewed. The patient's tolerance of                            previous anesthesia was also reviewed. The risks                            and benefits of the procedure and the sedation                            options and risks were discussed with the patient.                            All questions were answered, and informed consent                            was obtained. Prior Anticoagulants: The patient has                            taken no previous anticoagulant or antiplatelet                            agents. ASA Grade Assessment: II - A patient with                            mild systemic disease. After reviewing the risks                            and benefits, the patient was deemed in                            satisfactory condition to undergo the procedure.  After obtaining informed consent, the colonoscope                            was passed under direct vision. Throughout the                            procedure, the patient's blood pressure, pulse, and                            oxygen saturations were monitored continuously. The                            Olympus CF-HQ190L (Serial# [id number]) Colonoscope was                            introduced through the  anus and advanced to the the                            cecum, identified by appendiceal orifice and                            ileocecal valve. The colonoscopy was performed                            without difficulty. The patient tolerated the                            procedure well. The quality of the bowel                            preparation was good. The ileocecal valve,                            appendiceal orifice, and rectum were photographed.                            The bowel preparation used was SUPREP. Scope In: 10:48:30 AM Scope Out: 11:06:47 AM Scope Withdrawal Time: 0 hours 13 minutes 28 seconds  Total Procedure Duration: 0 hours 18 minutes 17 seconds  Findings:                 The perianal and digital rectal examinations were                            normal.                           Internal hemorrhoids were found.                           There is no endoscopic evidence of polyps in the                            entire colon.  The exam was otherwise without abnormality on                            direct and retroflexion views. Complications:            No immediate complications. Estimated Blood Loss:     Estimated blood loss: none. Impression:               - Internal hemorrhoids.                           - The examination was otherwise normal on direct                            and retroflexion views.                           - No specimens collected. Recommendation:           - Patient has a contact number available for                            emergencies. The signs and symptoms of potential                            delayed complications were discussed with the                            patient. Return to normal activities tomorrow.                            Written discharge instructions were provided to the                            patient.                           - Resume previous diet.                            - Continue present medications.                           - Repeat colonoscopy in 10 years for screening                            purposes. Rhylan Kagel L. Myrtie Neither, MD 07/17/2020 11:09:19 AM This report has been signed electronically.

## 2020-07-17 NOTE — Progress Notes (Signed)
Pt's states no medical or surgical changes since previsit or office visit. 

## 2020-07-17 NOTE — Patient Instructions (Signed)
Handout given for hemorrhoids.  Repeat colonoscopy in 10 years.  YOU HAD AN ENDOSCOPIC PROCEDURE TODAY AT THE [facility]:   Refer to the procedure report that was given to you for any specific questions about what was found during the examination.  If the procedure report does not answer your questions, please call your gastroenterologist to clarify.  If you requested that your care partner not be given the details of your procedure findings, then the procedure report has been included in a sealed envelope for you to review at your convenience later.  YOU SHOULD EXPECT: Some feelings of bloating in the abdomen. Passage of more gas than usual.  Walking can help get rid of the air that was put into your GI tract during the procedure and reduce the bloating. If you had a lower endoscopy (such as a colonoscopy or flexible sigmoidoscopy) you may notice spotting of blood in your stool or on the toilet paper. If you underwent a bowel prep for your procedure, you may not have a normal bowel movement for a few days.  Please Note:  You might notice some irritation and congestion in your nose or some drainage.  This is from the oxygen used during your procedure.  There is no need for concern and it should clear up in a day or so.  SYMPTOMS TO REPORT IMMEDIATELY:   Following lower endoscopy (colonoscopy or flexible sigmoidoscopy):  Excessive amounts of blood in the stool  Significant tenderness or worsening of abdominal pains  Swelling of the abdomen that is new, acute  Fever of 100F or higher  For urgent or emergent issues, a gastroenterologist can be reached at any hour by calling [phone]. Do not use MyChart messaging for urgent concerns.    DIET:  We do recommend a small meal at first, but then you may proceed to your regular diet.  Drink plenty of fluids but you should avoid alcoholic beverages for 24 hours.  ACTIVITY:  You should plan to take it easy for the rest of today and  you should NOT DRIVE or use heavy machinery until tomorrow (because of the sedation medicines used during the test).    FOLLOW UP: Our staff will call the number listed on your records 48-72 hours following your procedure to check on you and address any questions or concerns that you may have regarding the information given to you following your procedure. If we do not reach you, we will leave a message.  We will attempt to reach you two times.  During this call, we will ask if you have developed any symptoms of COVID 19. If you develop any symptoms (ie: fever, flu-like symptoms, shortness of breath, cough etc.) before then, please call [phone].  If you test positive for Covid 19 in the 2 weeks post procedure, please call and report this information to [place].    If any biopsies were taken you will be contacted by phone or by letter within the next 1-3 weeks.  Please call us at [phone] if you have not heard about the biopsies in 3 weeks.    SIGNATURES/CONFIDENTIALITY: You and/or your care partner have signed paperwork which will be entered into your electronic medical record.  These signatures attest to the fact that that the information above on your After Visit Summary has been reviewed and is understood.  Full responsibility of the confidentiality of this discharge information lies with you and/or your care-partner.

## 2020-07-17 NOTE — Progress Notes (Signed)
PT taken to PACU. Monitors in place. VSS. Report given to RN. 

## 2020-07-17 NOTE — Progress Notes (Signed)
C.W. vital signs. 

## 2020-07-19 ENCOUNTER — Telehealth: Payer: Self-pay | Admitting: *Deleted

## 2020-07-19 ENCOUNTER — Telehealth: Payer: Self-pay

## 2020-07-19 NOTE — Telephone Encounter (Signed)
  Follow up Call-  Call back number 07/17/2020  Post procedure Call Back phone  # [phone]  Permission to leave phone message Yes  Some recent data might be hidden     Patient questions:  Do you have a fever, pain , or abdominal swelling? No. Pain Score  0 *  Have you tolerated food without any problems? Yes.    Have you been able to return to your normal activities? Yes.    Do you have any questions about your discharge instructions: Diet   No. Medications  No. Follow up visit  No.  Do you have questions or concerns about your Care? No.  Actions: * If pain score is 4 or above: 1. No action needed, pain <4.Have you developed a fever since your procedure? no  2.   Have you had an respiratory symptoms (SOB or cough) since your procedure? no  3.   Have you tested positive for COVID 19 since your procedure no  4.   Have you had any family members/close contacts diagnosed with the COVID 19 since your procedure?  no   If yes to any of these questions please route to Laverna Peace, RN and Karlton Lemon, RN

## 2020-07-19 NOTE — Telephone Encounter (Signed)
First attempt, left VM.  

## 2020-10-08 ENCOUNTER — Other Ambulatory Visit: Payer: Self-pay

## 2020-10-08 ENCOUNTER — Encounter: Payer: Self-pay | Admitting: Obstetrics & Gynecology

## 2020-10-08 ENCOUNTER — Ambulatory Visit (INDEPENDENT_AMBULATORY_CARE_PROVIDER_SITE_OTHER): Admitting: Obstetrics & Gynecology

## 2020-10-08 ENCOUNTER — Ambulatory Visit: Admitting: Family Medicine

## 2020-10-08 VITALS — BP 162/110 | Ht 66.0 in | Wt 172.8 lb

## 2020-10-08 DIAGNOSIS — R6882 Decreased libido: Secondary | ICD-10-CM

## 2020-10-08 DIAGNOSIS — N951 Menopausal and female climacteric states: Secondary | ICD-10-CM

## 2020-10-08 DIAGNOSIS — Z1231 Encounter for screening mammogram for malignant neoplasm of breast: Secondary | ICD-10-CM | POA: Diagnosis not present

## 2020-10-08 DIAGNOSIS — Z0289 Encounter for other administrative examinations: Secondary | ICD-10-CM

## 2020-10-08 MED ORDER — ADDYI 100 MG PO TABS: 100.0000 mg | ORAL_TABLET | Freq: Every day | ORAL | 1 refills | Status: DC

## 2020-10-08 MED ORDER — ADDYI 100 MG PO TABS: 100.0000 mg | ORAL_TABLET | Freq: Every day | ORAL | 10 refills | Status: DC

## 2020-10-08 NOTE — Patient Instructions (Signed)
Two hyaluronic acid vaginal moisturizers  HYALO GYN is a personal lubricant intended to moisturize and lubricate to enhance the ease and comfort of intimate sexual activity and supplement the body's natural lubrication. It is available in a colorless, odorless, transparent, aqueous, hydrating gel and a moisturizing suppository, both hormone-free and made without parabens. HYALO GYN acts as a moisturizer and lubricant because of the strong hydrating properties of its proprietary hyaluronic acid derivative component, Hydeal-D.  Revaree provides powerful, hormone-free relief from vaginal dryness, with an easy-to-use vaginal insert that renews your body's moisture for everyday comfort and intimacy. 

## 2020-10-08 NOTE — Progress Notes (Signed)
GYNECOLOGY OFFICE VISIT NOTE  History:   Brenda Barrett is a 50 y.o. F. here today to establish care. Was a patient of Dr. Purnell Shoemaker, unable to see her now due to insurance restrictions. Reports having decreased libido, has been on Addyi and wants a refill of this.  Also reports some dryness, but she largely attributes to the the decreased libido. Rare episodes of hot flashes. No night sweats.  She denies any abnormal vaginal discharge, bleeding, pelvic pain or other concerns.    Past Medical History:  Diagnosis Date  . Anemia   . Depression   . GERD (gastroesophageal reflux disease)   . Glaucoma    sees Dr. Arvil Chaco   . Hyperlipidemia   . Hypertension   . Migraines   . Seasonal allergies   . Thyroid disease 2012   multinodular goiter per biopsy, hypothyroidism     Past Surgical History:  Procedure Laterality Date  . EYE SURGERY Left 07/2019   Cataract   . EYE SURGERY Right 09/2019   Cataract and glaucoma  . TUBAL LIGATION      The following portions of the patient's history were reviewed and updated as appropriate: allergies, current medications, past family history, past medical history, past social history, past surgical history and problem list.   Health Maintenance:  Normal pap and negative HRHPV in 08/2018 with Dr. Cherly Hensen.  Normal mammogram about 4 years ago.  Review of Systems:  Pertinent items noted in HPI and remainder of comprehensive ROS otherwise negative.  Physical Exam:  BP (!) 162/110   Ht 5\' 6"  (1.676 m)   Wt 172 lb 12.8 oz (78.4 kg)   BMI 27.89 kg/m  CONSTITUTIONAL: Well-developed, well-nourished female in no acute distress.  HEENT:  Normocephalic, atraumatic. External right and left ear normal. No scleral icterus.  NECK: Normal range of motion, supple, no masses noted on observation SKIN: No rash noted. Not diaphoretic. No erythema. No pallor. MUSCULOSKELETAL: Normal range of motion. No edema noted. NEUROLOGIC: Alert and oriented to person,  place, and time. Normal muscle tone coordination. No cranial nerve deficit noted. PSYCHIATRIC: Normal mood and affect. Normal behavior. Normal judgment and thought content. CARDIOVASCULAR: Normal heart rate noted RESPIRATORY: Effort and breath sounds normal, no problems with respiration noted ABDOMEN: No masses noted. No other overt distention noted.   PELVIC: Deferred     Assessment and Plan:    1. Low libido Has been on this in the past. This was refilled.  - Flibanserin (ADDYI) 100 MG TABS; Take 100 mg by mouth at bedtime.  Dispense: 30 tablet; Refill: 10  2. Perimenopausal vasomotor symptoms Patient with some menopausal vasomotor symptoms. Discussed lifestyle interventions such as wearing light clothing, remaining in cool environments, having fan/air conditioner in the room, avoiding hot beverages etc.  Discussed using hormone therapy and concerns about increased risk of heart disease, cerebrovascular disease, thromboembolic disease,  and breast cancer.  Also discussed alternative therapies such as herbal remedies but cautioned that most of the products contained phytoestrogens (plant estrogens) in unregulated amounts which can have the same effects on the body as the pharmaceutical estrogen preparations.  Also discussed other medical options such as antidepressant therapy or Neurontin; she has noticed her Zoloft helps a lot with hot hot flashes. She desires no further intervention for now.  3. Breast cancer screening by mammogram - MM 3D SCREEN BREAST BILATERAL; Future ordered, will follow up results and manage accordingly.  Routine preventative health maintenance measures emphasized. Please refer to After  Visit Summary for other counseling recommendations.   Return for any gynecologic concerns.    I spent 20 minutes dedicated to the care of this patient including pre-visit review of records, face to face time with the patient discussing her conditions and treatments and post visit  ordering of testing.    Jaynie Collins, MD, FACOG Obstetrician & Gynecologist, [facility] for [employer], [facility]

## 2020-10-10 ENCOUNTER — Telehealth: Payer: Self-pay | Admitting: *Deleted

## 2020-10-10 ENCOUNTER — Telehealth: Admitting: Physician Assistant

## 2020-10-10 DIAGNOSIS — Z760 Encounter for issue of repeat prescription: Secondary | ICD-10-CM | POA: Diagnosis not present

## 2020-10-10 MED ORDER — BUPROPION HCL ER (XL) 300 MG PO TB24
300.0000 mg | ORAL_TABLET | Freq: Every day | ORAL | 0 refills | Status: DC
Start: 2020-10-10 — End: 2021-02-04

## 2020-10-10 NOTE — Patient Instructions (Signed)
Bupropion tablets (Depression/Mood Disorders) What is this medicine? BUPROPION (byoo PROE pee on) is used to treat depression. This medicine may be used for other purposes; ask your health care provider or pharmacist if you have questions. COMMON BRAND NAME(S): Wellbutrin What should I tell my health care provider before I take this medicine? They need to know if you have any of these conditions:  an eating disorder, such as anorexia or bulimia  bipolar disorder or psychosis  diabetes or high blood sugar, treated with medication  glaucoma  heart disease, previous heart attack, or irregular heart beat  head injury or brain tumor  high blood pressure  kidney or liver disease  seizures  suicidal thoughts or a previous suicide attempt  Tourette's syndrome  weight loss  an unusual or allergic reaction to bupropion, other medicines, foods, dyes, or preservatives  breast-feeding  pregnant or trying to become pregnant How should I use this medicine? Take this medicine by mouth with a glass of water. Follow the directions on the prescription label. You can take it with or without food. If it upsets your stomach, take it with food. Take your medicine at regular intervals. Do not take your medicine more often than directed. Do not stop taking this medicine suddenly except upon the advice of your doctor. Stopping this medicine too quickly may cause serious side effects or your condition may worsen. A special MedGuide will be given to you by the pharmacist with each prescription and refill. Be sure to read this information carefully each time. Talk to your pediatrician regarding the use of this medicine in children. Special care may be needed. Overdosage: If you think you have taken too much of this medicine contact a poison control center or emergency room at once. NOTE: This medicine is only for you. Do not share this medicine with others. What if I miss a dose? If you miss a dose,  take it as soon as you can. If it is less than four hours to your next dose, take only that dose and skip the missed dose. Do not take double or extra doses. What may interact with this medicine? Do not take this medicine with any of the following medications:  linezolid  MAOIs like Azilect, Carbex, Eldepryl, Marplan, Nardil, and Parnate  methylene blue (injected into a vein)  other medicines that contain bupropion like Zyban This medicine may also interact with the following medications:  alcohol  certain medicines for anxiety or sleep  certain medicines for blood pressure like metoprolol, propranolol  certain medicines for depression or psychotic disturbances  certain medicines for HIV or AIDS like efavirenz, lopinavir, nelfinavir, ritonavir  certain medicines for irregular heart beat like propafenone, flecainide  certain medicines for Parkinson's disease like amantadine, levodopa  certain medicines for seizures like carbamazepine, phenytoin, phenobarbital  cimetidine  clopidogrel  cyclophosphamide  digoxin  furazolidone  isoniazid  nicotine  orphenadrine  procarbazine  steroid medicines like prednisone or cortisone  stimulant medicines for attention disorders, weight loss, or to stay awake  tamoxifen  theophylline  thiotepa  ticlopidine  tramadol  warfarin This list may not describe all possible interactions. Give your health care provider a list of all the medicines, herbs, non-prescription drugs, or dietary supplements you use. Also tell them if you smoke, drink alcohol, or use illegal drugs. Some items may interact with your medicine. What should I watch for while using this medicine? Tell your doctor if your symptoms do not get better or if they get worse.   Visit your doctor or healthcare provider for regular checks on your progress. Because it may take several weeks to see the full effects of this medicine, it is important to continue your  treatment as prescribed by your doctor. This medicine may cause serious skin reactions. They can happen weeks to months after starting the medicine. Contact your healthcare provider right away if you notice fevers or flu-like symptoms with a rash. The rash may be red or purple and then turn into blisters or peeling of the skin. Or, you might notice a red rash with swelling of the face, lips or lymph nodes in your neck or under your arms. Patients and their families should watch out for new or worsening thoughts of suicide or depression. Also watch out for sudden changes in feelings such as feeling anxious, agitated, panicky, irritable, hostile, aggressive, impulsive, severely restless, overly excited and hyperactive, or not being able to sleep. If this happens, especially at the beginning of treatment or after a change in dose, call your healthcare provider. Avoid alcoholic drinks while taking this medicine. Drinking excessive alcoholic beverages, using sleeping or anxiety medicines, or quickly stopping the use of these agents while taking this medicine may increase your risk for a seizure. Do not drive or use heavy machinery until you know how this medicine affects you. This medicine can impair your ability to perform these tasks. Do not take this medicine close to bedtime. It may prevent you from sleeping. Your mouth may get dry. Chewing sugarless gum or sucking hard candy, and drinking plenty of water may help. Contact your doctor if the problem does not go away or is severe. What side effects may I notice from receiving this medicine? Side effects that you should report to your doctor or health care professional as soon as possible:  allergic reactions like skin rash, itching or hives, swelling of the face, lips, or tongue  breathing problems  changes in vision  confusion  elevated mood, decreased need for sleep, racing thoughts, impulsive behavior  fast or irregular  heartbeat  hallucinations, loss of contact with reality  increased blood pressure  rash, fever, and swollen lymph nodes  redness, blistering, peeling, or loosening of the skin, including inside the mouth  seizures  suicidal thoughts or other mood changes  unusually weak or tired  vomiting Side effects that usually do not require medical attention (report to your doctor or health care professional if they continue or are bothersome):  constipation  headache  loss of appetite  nausea  tremors  weight loss This list may not describe all possible side effects. Call your doctor for medical advice about side effects. You may report side effects to FDA at 1-800-FDA-1088. Where should I keep my medicine? Keep out of the reach of children. Store at room temperature between 20 and 25 degrees C (68 and 77 degrees F), away from direct sunlight and moisture. Keep tightly closed. Throw away any unused medicine after the expiration date. NOTE: This sheet is a summary. It may not cover all possible information. If you have questions about this medicine, talk to your doctor, pharmacist, or health care provider.  2021 Elsevier/Gold Standard (2018-09-15 14:02:47)  

## 2020-10-10 NOTE — Progress Notes (Signed)
Brenda Barrett, Brenda Barrett are scheduled for a virtual visit with your provider today.    Just as we do with appointments in the office, we must obtain your consent to participate.  Your consent will be active for this visit and any virtual visit you may have with one of our providers in the next 365 days.    If you have a MyChart account, I can also send a copy of this consent to you electronically.  All virtual visits are billed to your insurance company just like a traditional visit in the office.  As this is a virtual visit, video technology does not allow for your provider to perform a traditional examination.  This may limit your provider'Barrett ability to fully assess your condition.  If your provider identifies any concerns that need to be evaluated in person or the need to arrange testing such as labs, EKG, etc, we will make arrangements to do so.    Although advances in technology are sophisticated, we cannot ensure that it will always work on either your end or our end.  If the connection with a video visit is poor, we may have to switch to a telephone visit.  With either a video or telephone visit, we are not always able to ensure that we have a secure connection.   I need to obtain your verbal consent now.   Are you willing to proceed with your visit today?   Brenda Barrett has provided verbal consent on 10/10/2020 for a virtual visit (video or telephone).   Brenda Meres, Brenda Barrett 10/10/2020  9:49 AM   Brenda Barrett, Brenda Barrett are scheduled for a virtual visit with your provider today.    Just as we do with appointments in the office, we must obtain your consent to participate.  Your consent will be active for this visit and any virtual visit you may have with one of our providers in the next 365 days.    If you have a MyChart account, I can also send a copy of this consent to you electronically.  All virtual visits are billed to your insurance company just like a traditional visit in the office.  As this is  a virtual visit, video technology does not allow for your provider to perform a traditional examination.  This may limit your provider'Barrett ability to fully assess your condition.  If your provider identifies any concerns that need to be evaluated in person or the need to arrange testing such as labs, EKG, etc, we will make arrangements to do so.    Although advances in technology are sophisticated, we cannot ensure that it will always work on either your end or our end.  If the connection with a video visit is poor, we may have to switch to a telephone visit.  With either a video or telephone visit, we are not always able to ensure that we have a secure connection.   I need to obtain your verbal consent now.   Are you willing to proceed with your visit today?   Brenda Barrett has provided verbal consent on 10/10/2020 for a virtual visit (video or telephone).   Brenda Meres, Brenda Barrett 10/10/2020  9:49 AM   Date:  10/10/2020   ID:  Brenda Barrett, DOB 1971-04-30, MRN [id number]  Patient Location: Home Provider Location: Home Office   Participants: Patient and Provider for Visit and Wrap up  Method of visit: Video  Location of Patient: Home Location of Provider: Home Office Consent  was obtain for visit over the video. Services rendered by provider: Visit was performed via video  A video enabled telemedicine application was used and I verified that I am speaking with the correct person using two identifiers.  PCP:  Nelwyn Salisbury, MD   Chief Complaint:  Medication refill  History of Present Illness:    Brenda Barrett is a 50 y.o. female with history as stated below. Presents video telehealth for an acute care visit  Pt requesting medication refill for Wellbutrin. Has been on 300 mg XL for many years and is doing well. She has no complaints at this time.   Denies having fevers, chills, shortness of breath, cough, chest pain, ear pain, sore throat or exposure to covid or other sick  contacts. No other aggravating or relieving factors.  No other c/o.  The patient does not have symptoms concerning for COVID-19 infection (fever, chills, cough, or new shortness of breath).   Past Medical, Surgical, Social History, Allergies, and Medications have been Reviewed.  Past Medical History:  Diagnosis Date  . Anemia   . Depression   . GERD (gastroesophageal reflux disease)   . Glaucoma    sees Dr. Arvil Chaco   . Hyperlipidemia   . Hypertension   . Migraines   . Seasonal allergies   . Thyroid disease 2012   multinodular goiter per biopsy, hypothyroidism     No outpatient medications have been marked as taking for the 10/10/20 encounter (Appointment) with Brenda Barrett, Brenda Vicario Barrett, Brenda Barrett.     Allergies:   Penicillins and Citalopram hydrobromide   ROS See HPI for history of present illness.  Physical Exam            There are no diagnoses linked to this encounter.   Time:   Today, I have spent 10 minutes with the patient with telehealth technology discussing the above problems, reviewing the chart, previous notes, medications and orders.   MDM: requesting refill for Wellbutrin. Pt has been on medication for several years and is doing well on it. Will refill for 30 days. Advised that she will need to f/u with PCP for further refills. She is in agreement with plan. Message will be sent to PCP regarding visit.  Tests Ordered: No orders of the defined types were placed in this encounter.   Medication Changes: No orders of the defined types were placed in this encounter.    Disposition:  Follow up  Signed, Brenda Meres, Brenda Barrett  10/10/2020 9:49 AM

## 2020-10-10 NOTE — Telephone Encounter (Signed)
Addyi approved per Cover My meds from 09/08/2020-01/08/2021.

## 2020-10-15 ENCOUNTER — Other Ambulatory Visit: Payer: Self-pay | Admitting: Family Medicine

## 2020-12-27 ENCOUNTER — Other Ambulatory Visit: Payer: Self-pay | Admitting: Family Medicine

## 2021-02-04 ENCOUNTER — Encounter: Payer: Self-pay | Admitting: Family Medicine

## 2021-02-04 ENCOUNTER — Other Ambulatory Visit: Payer: Self-pay

## 2021-02-04 ENCOUNTER — Ambulatory Visit (INDEPENDENT_AMBULATORY_CARE_PROVIDER_SITE_OTHER): Payer: Self-pay | Admitting: Family Medicine

## 2021-02-04 VITALS — BP 128/80 | HR 62 | Temp 98.9°F | Wt 173.0 lb

## 2021-02-04 DIAGNOSIS — E785 Hyperlipidemia, unspecified: Secondary | ICD-10-CM

## 2021-02-04 DIAGNOSIS — E039 Hypothyroidism, unspecified: Secondary | ICD-10-CM

## 2021-02-04 DIAGNOSIS — F418 Other specified anxiety disorders: Secondary | ICD-10-CM

## 2021-02-04 DIAGNOSIS — I10 Essential (primary) hypertension: Secondary | ICD-10-CM

## 2021-02-04 MED ORDER — LEVOTHYROXINE SODIUM 25 MCG PO TABS: 25.0000 ug | ORAL_TABLET | Freq: Every day | ORAL | 3 refills | Status: DC

## 2021-02-04 MED ORDER — HYDROCHLOROTHIAZIDE 25 MG PO TABS: 25.0000 mg | ORAL_TABLET | Freq: Every day | ORAL | 3 refills | Status: DC

## 2021-02-04 MED ORDER — AMLODIPINE BESYLATE 5 MG PO TABS
ORAL_TABLET | ORAL | 3 refills | Status: DC
Start: 2021-02-04 — End: 2022-05-04

## 2021-02-04 MED ORDER — DICLOFENAC SODIUM 75 MG PO TBEC: 75.0000 mg | DELAYED_RELEASE_TABLET | Freq: Two times a day (BID) | ORAL | 3 refills | Status: AC

## 2021-02-04 MED ORDER — POTASSIUM CHLORIDE ER 10 MEQ PO TBCR: 10.0000 meq | EXTENDED_RELEASE_TABLET | Freq: Every day | ORAL | 3 refills | Status: DC

## 2021-02-04 MED ORDER — BUPROPION HCL ER (XL) 300 MG PO TB24: 300.0000 mg | ORAL_TABLET | Freq: Every day | ORAL | 3 refills | Status: DC

## 2021-02-04 MED ORDER — VENLAFAXINE HCL ER 75 MG PO CP24: 75.0000 mg | ORAL_CAPSULE | Freq: Every day | ORAL | 2 refills | Status: DC

## 2021-02-04 NOTE — Progress Notes (Signed)
   Subjective:    Patient ID: Brenda Barrett, female    DOB: Mar 29, 1971, 50 y.o.   MRN: [id number]  HPI Here to follow up on HTN and for medication refills. She also says the Zoloft does not work as well as it used to. Her anxiety has been a problem lately and she wants to avoid using Xanax as much as possible. She sleeps well. Her BP is stable.    Review of Systems  Constitutional: Negative.   Respiratory: Negative.    Cardiovascular: Negative.   Psychiatric/Behavioral:  Positive for decreased concentration and dysphoric mood. Negative for agitation, behavioral problems, confusion, hallucinations, self-injury, sleep disturbance and suicidal ideas. The patient is nervous/anxious.       Objective:   Physical Exam Constitutional:      Appearance: Normal appearance.  Cardiovascular:     Rate and Rhythm: Normal rate and regular rhythm.     Pulses: Normal pulses.     Heart sounds: Normal heart sounds.  Pulmonary:     Effort: Pulmonary effort is normal.     Breath sounds: Normal breath sounds.  Musculoskeletal:     Right lower leg: No edema.     Left lower leg: No edema.  Neurological:     General: No focal deficit present.     Mental Status: She is alert and oriented to person, place, and time.  Psychiatric:        Behavior: Behavior normal.        Thought Content: Thought content normal.     Comments: Mildly anxious           Assessment & Plan:  Her HTN is stable. Her BP medications are refilled. For the depression with anxiety she will stay on Wellbutrin XL, but we will stop the Zoloft and substitute Effexor XR 75 mg daily. She will return for a well exam with fasting labs in a few weeks. We spent 35 minutes reviewing records and discussing these issues.   Gershon Crane, MD

## 2021-02-14 ENCOUNTER — Other Ambulatory Visit

## 2021-02-14 ENCOUNTER — Other Ambulatory Visit: Payer: Self-pay

## 2021-02-27 ENCOUNTER — Telehealth: Payer: Self-pay | Admitting: Family Medicine

## 2021-02-27 NOTE — Telephone Encounter (Signed)
Patient wants to speak with someone about the medication that was prescribed to her on her last visit.  Patient can be contacted at [phone]  Please advise.

## 2021-02-27 NOTE — Telephone Encounter (Signed)
Pt states that her anxiety medication was changed to Effexor on 02/04/2021, pt wants to know long the medication takes to get into the system, pt states that she still is very anxious and wants to know if she should wait to see if medication works or schedule a f/u appointment.

## 2021-02-28 ENCOUNTER — Other Ambulatory Visit: Payer: Self-pay

## 2021-02-28 MED ORDER — VENLAFAXINE HCL ER 150 MG PO CP24: ORAL_CAPSULE | ORAL | 2 refills | Status: DC

## 2021-02-28 NOTE — Telephone Encounter (Signed)
Spoke with patient, Effexor 150mg  sent to [facility] in [place].

## 2021-02-28 NOTE — Telephone Encounter (Signed)
The Effexor is working at a full level by now, so we will increase this to Effexor XR 150 mg daily, call in #30 with 2 rf

## 2021-03-22 ENCOUNTER — Other Ambulatory Visit: Payer: Self-pay | Admitting: Family Medicine

## 2021-03-24 ENCOUNTER — Encounter: Admitting: Family Medicine

## 2021-04-01 ENCOUNTER — Other Ambulatory Visit: Payer: Self-pay

## 2021-04-02 ENCOUNTER — Encounter: Payer: Self-pay | Admitting: Family Medicine

## 2021-04-02 ENCOUNTER — Ambulatory Visit (INDEPENDENT_AMBULATORY_CARE_PROVIDER_SITE_OTHER): Admitting: Family Medicine

## 2021-04-02 VITALS — BP 124/80 | HR 62 | Temp 99.1°F | Ht 66.0 in | Wt 164.0 lb

## 2021-04-02 DIAGNOSIS — Z0000 Encounter for general adult medical examination without abnormal findings: Secondary | ICD-10-CM

## 2021-04-02 DIAGNOSIS — E039 Hypothyroidism, unspecified: Secondary | ICD-10-CM

## 2021-04-02 MED ORDER — SUMATRIPTAN SUCCINATE 100 MG PO TABS: 100.0000 mg | ORAL_TABLET | ORAL | 3 refills | Status: DC | PRN

## 2021-04-02 MED ORDER — ATORVASTATIN CALCIUM 20 MG PO TABS: 20.0000 mg | ORAL_TABLET | Freq: Every day | ORAL | 3 refills | Status: DC

## 2021-04-02 NOTE — Progress Notes (Signed)
   Subjective:    Patient ID: Brenda Barrett, female    DOB: 06-02-1971, 50 y.o.   MRN: [id number]  HPI Here for a well exam. She feels great.    Review of Systems  Constitutional: Negative.   HENT: Negative.    Eyes: Negative.   Respiratory: Negative.    Cardiovascular: Negative.   Gastrointestinal: Negative.   Genitourinary:  Negative for decreased urine volume, difficulty urinating, dyspareunia, dysuria, enuresis, flank pain, frequency, hematuria, pelvic pain and urgency.  Musculoskeletal: Negative.   Skin: Negative.   Neurological: Negative.  Negative for headaches.  Psychiatric/Behavioral: Negative.        Objective:   Physical Exam Constitutional:      General: She is not in acute distress.    Appearance: Normal appearance. She is well-developed.  HENT:     Head: Normocephalic and atraumatic.     Right Ear: External ear normal.     Left Ear: External ear normal.     Nose: Nose normal.     Mouth/Throat:     Pharynx: No oropharyngeal exudate.  Eyes:     General: No scleral icterus.    Conjunctiva/sclera: Conjunctivae normal.     Pupils: Pupils are equal, round, and reactive to light.  Neck:     Thyroid: No thyromegaly.     Vascular: No JVD.  Cardiovascular:     Rate and Rhythm: Normal rate and regular rhythm.     Heart sounds: Normal heart sounds. No murmur heard.   No friction rub. No gallop.  Pulmonary:     Effort: Pulmonary effort is normal. No respiratory distress.     Breath sounds: Normal breath sounds. No wheezing or rales.  Chest:     Chest wall: No tenderness.  Abdominal:     General: Bowel sounds are normal. There is no distension.     Palpations: Abdomen is soft. There is no mass.     Tenderness: There is no abdominal tenderness. There is no guarding or rebound.  Musculoskeletal:        General: No tenderness. Normal range of motion.     Cervical back: Normal range of motion and neck supple.  Lymphadenopathy:     Cervical: No cervical  adenopathy.  Skin:    General: Skin is warm and dry.     Findings: No erythema or rash.  Neurological:     Mental Status: She is alert and oriented to person, place, and time.     Cranial Nerves: No cranial nerve deficit.     Motor: No abnormal muscle tone.     Coordination: Coordination normal.     Deep Tendon Reflexes: Reflexes are normal and symmetric. Reflexes normal.  Psychiatric:        Behavior: Behavior normal.        Thought Content: Thought content normal.        Judgment: Judgment normal.          Assessment & Plan:  Well exam. We discussed diet and exercise. Get fasting labs. Gershon Crane, MD

## 2021-04-04 ENCOUNTER — Other Ambulatory Visit: Payer: Self-pay

## 2021-04-04 ENCOUNTER — Other Ambulatory Visit (INDEPENDENT_AMBULATORY_CARE_PROVIDER_SITE_OTHER)

## 2021-04-04 DIAGNOSIS — Z0000 Encounter for general adult medical examination without abnormal findings: Secondary | ICD-10-CM

## 2021-04-04 DIAGNOSIS — E039 Hypothyroidism, unspecified: Secondary | ICD-10-CM | POA: Diagnosis not present

## 2021-04-04 DIAGNOSIS — E785 Hyperlipidemia, unspecified: Secondary | ICD-10-CM

## 2021-04-04 LAB — TSH: TSH: 1.79 u[IU]/mL (ref 0.35–5.50)

## 2021-04-04 LAB — HEPATIC FUNCTION PANEL
ALT: 17 U/L (ref 0–35)
AST: 20 U/L (ref 0–37)
Albumin: 4.1 g/dL (ref 3.5–5.2)
Alkaline Phosphatase: 123 U/L — ABNORMAL HIGH (ref 39–117)
Bilirubin, Direct: 0.1 mg/dL (ref 0.0–0.3)
Total Bilirubin: 0.5 mg/dL (ref 0.2–1.2)
Total Protein: 8.1 g/dL (ref 6.0–8.3)

## 2021-04-04 LAB — CBC WITH DIFFERENTIAL/PLATELET
Basophils Absolute: 0 10*3/uL (ref 0.0–0.1)
Basophils Relative: 0.7 % (ref 0.0–3.0)
Eosinophils Absolute: 0.2 10*3/uL (ref 0.0–0.7)
Eosinophils Relative: 4.2 % (ref 0.0–5.0)
HCT: 34.8 % — ABNORMAL LOW (ref 36.0–46.0)
Hemoglobin: 11.6 g/dL — ABNORMAL LOW (ref 12.0–15.0)
Lymphocytes Relative: 46.2 % — ABNORMAL HIGH (ref 12.0–46.0)
Lymphs Abs: 2.3 10*3/uL (ref 0.7–4.0)
MCHC: 33.4 g/dL (ref 30.0–36.0)
MCV: 85.1 fl (ref 78.0–100.0)
Monocytes Absolute: 0.3 10*3/uL (ref 0.1–1.0)
Monocytes Relative: 5.4 % (ref 3.0–12.0)
Neutro Abs: 2.2 10*3/uL (ref 1.4–7.7)
Neutrophils Relative %: 43.5 % (ref 43.0–77.0)
Platelets: 289 10*3/uL (ref 150.0–400.0)
RBC: 4.09 Mil/uL (ref 3.87–5.11)
RDW: 12.6 % (ref 11.5–15.5)
WBC: 5 10*3/uL (ref 4.0–10.5)

## 2021-04-04 LAB — LIPID PANEL
Cholesterol: 311 mg/dL — ABNORMAL HIGH (ref 0–200)
HDL: 40.2 mg/dL (ref >39.00)
NonHDL: 270.7
Total CHOL/HDL Ratio: 8
Triglycerides: 375 mg/dL — ABNORMAL HIGH (ref 0.0–149.0)
VLDL: 75 mg/dL — ABNORMAL HIGH (ref 0.0–40.0)

## 2021-04-04 LAB — HEMOGLOBIN A1C: Hgb A1c MFr Bld: 6 % (ref 4.6–6.5)

## 2021-04-04 LAB — BASIC METABOLIC PANEL
BUN: 5 mg/dL — ABNORMAL LOW (ref 6–23)
CO2: 27 mEq/L (ref 19–32)
Calcium: 9.6 mg/dL (ref 8.4–10.5)
Chloride: 101 mEq/L (ref 96–112)
Creatinine, Ser: 0.78 mg/dL (ref 0.40–1.20)
GFR: 87.31 mL/min (ref >60.00)
Glucose, Bld: 92 mg/dL (ref 70–99)
Potassium: 3.8 mEq/L (ref 3.5–5.1)
Sodium: 137 mEq/L (ref 135–145)

## 2021-04-04 LAB — T3, FREE: T3, Free: 2.9 pg/mL (ref 2.3–4.2)

## 2021-04-04 LAB — LDL CHOLESTEROL, DIRECT: Direct LDL: 142 mg/dL

## 2021-04-04 LAB — T4, FREE: Free T4: 0.85 ng/dL (ref 0.60–1.60)

## 2021-04-04 MED ORDER — ATORVASTATIN CALCIUM 40 MG PO TABS: 40.0000 mg | ORAL_TABLET | Freq: Every day | ORAL | 3 refills | Status: DC

## 2021-05-09 ENCOUNTER — Ambulatory Visit (INDEPENDENT_AMBULATORY_CARE_PROVIDER_SITE_OTHER): Admitting: *Deleted

## 2021-05-09 ENCOUNTER — Other Ambulatory Visit: Payer: Self-pay

## 2021-05-09 DIAGNOSIS — Z23 Encounter for immunization: Secondary | ICD-10-CM | POA: Diagnosis not present

## 2021-07-02 ENCOUNTER — Other Ambulatory Visit (INDEPENDENT_AMBULATORY_CARE_PROVIDER_SITE_OTHER)

## 2021-07-02 DIAGNOSIS — E785 Hyperlipidemia, unspecified: Secondary | ICD-10-CM

## 2021-07-02 LAB — HEPATIC FUNCTION PANEL
ALT: 15 U/L (ref 0–35)
AST: 21 U/L (ref 0–37)
Albumin: 4.2 g/dL (ref 3.5–5.2)
Alkaline Phosphatase: 132 U/L — ABNORMAL HIGH (ref 39–117)
Bilirubin, Direct: 0.1 mg/dL (ref 0.0–0.3)
Total Bilirubin: 0.4 mg/dL (ref 0.2–1.2)
Total Protein: 8 g/dL (ref 6.0–8.3)

## 2021-07-02 LAB — LIPID PANEL
Cholesterol: 221 mg/dL — ABNORMAL HIGH (ref 0–200)
HDL: 37.4 mg/dL — ABNORMAL LOW (ref >39.00)
NonHDL: 183.45
Total CHOL/HDL Ratio: 6
Triglycerides: 331 mg/dL — ABNORMAL HIGH (ref 0.0–149.0)
VLDL: 66.2 mg/dL — ABNORMAL HIGH (ref 0.0–40.0)

## 2021-07-02 LAB — LDL CHOLESTEROL, DIRECT: Direct LDL: 108 mg/dL

## 2021-07-02 NOTE — Addendum Note (Signed)
Addended by: Marian Sorrow D on: 07/02/2021 08:38 AM   Modules accepted: Orders

## 2021-09-17 ENCOUNTER — Telehealth: Payer: Self-pay | Admitting: Family Medicine

## 2021-09-17 NOTE — Telephone Encounter (Signed)
Pt has an appt with dr fry on 10-03-2021 and would like a refill on venlafaxine XR (EFFEXOR-XR) 150 MG 24 hr capsule   ?[id number] - [place], [place] - [place] Phone:  [phone]  ?Fax:  [phone]  ?  ?  ?

## 2021-09-18 MED ORDER — VENLAFAXINE HCL ER 150 MG PO CP24: ORAL_CAPSULE | ORAL | 0 refills | Status: DC

## 2021-09-18 NOTE — Telephone Encounter (Signed)
Refill for Venlafaxin 150mg   has been sent to [facility] on [address].  ?

## 2021-10-03 ENCOUNTER — Ambulatory Visit: Admitting: Family Medicine

## 2021-10-13 ENCOUNTER — Encounter: Payer: Self-pay | Admitting: Family Medicine

## 2021-10-13 ENCOUNTER — Ambulatory Visit (INDEPENDENT_AMBULATORY_CARE_PROVIDER_SITE_OTHER): Admitting: Family Medicine

## 2021-10-13 VITALS — BP 120/80 | HR 58 | Temp 98.5°F | Ht 66.0 in | Wt 152.2 lb

## 2021-10-13 DIAGNOSIS — E785 Hyperlipidemia, unspecified: Secondary | ICD-10-CM | POA: Diagnosis not present

## 2021-10-13 DIAGNOSIS — I10 Essential (primary) hypertension: Secondary | ICD-10-CM | POA: Diagnosis not present

## 2021-10-13 DIAGNOSIS — F418 Other specified anxiety disorders: Secondary | ICD-10-CM | POA: Diagnosis not present

## 2021-10-13 MED ORDER — BUPROPION HCL ER (XL) 150 MG PO TB24: 150.0000 mg | ORAL_TABLET | Freq: Every day | ORAL | 3 refills | Status: DC

## 2021-10-13 MED ORDER — VENLAFAXINE HCL ER 150 MG PO CP24: ORAL_CAPSULE | ORAL | 3 refills | Status: DC

## 2021-10-13 NOTE — Progress Notes (Signed)
? ?  Subjective:  ? ? Patient ID: Brenda Barrett, female    DOB: 1970-10-31, 51 y.o.   MRN: [id number] ? ?HPI ?Here to follow up on HTN and dyslipidemia. She has been working hard o her diet, and she has lost some weight. She feels much better overall.  ? ? ?Review of Systems  ?Constitutional: Negative.   ?Respiratory: Negative.    ?Cardiovascular: Negative.   ? ?   ?Objective:  ? Physical Exam ?Constitutional:   ?   Appearance: Normal appearance.  ?Cardiovascular:  ?   Rate and Rhythm: Normal rate and regular rhythm.  ?   Pulses: Normal pulses.  ?   Heart sounds: Normal heart sounds.  ?Pulmonary:  ?   Effort: Pulmonary effort is normal.  ?   Breath sounds: Normal breath sounds.  ?Neurological:  ?   Mental Status: She is alert.  ? ? ? ? ? ?   ?Assessment & Plan:  ?HTN is well controlled. She will set up fasting labs later this week to check lipids.  ?Gershon Crane, MD ? ? ?

## 2021-10-14 ENCOUNTER — Other Ambulatory Visit

## 2021-12-05 ENCOUNTER — Telehealth: Payer: Self-pay | Admitting: Family Medicine

## 2021-12-05 NOTE — Telephone Encounter (Signed)
Pt requesting a prescription for  Bimatoprost (LUMIGAN OP)  [facility] [address] [place], [place] [address] [phone]

## 2021-12-08 NOTE — Telephone Encounter (Signed)
Last refill- by Historical provider Last OV- 10/13/2021  No future OV scheduled.    Can this patient receive a refill?

## 2021-12-08 NOTE — Telephone Encounter (Signed)
Called patient, message given.   Voiced understanding.

## 2021-12-08 NOTE — Telephone Encounter (Signed)
This needs to come from her eye doctor.

## 2022-05-04 ENCOUNTER — Encounter: Payer: Self-pay | Admitting: Family Medicine

## 2022-05-04 ENCOUNTER — Telehealth (INDEPENDENT_AMBULATORY_CARE_PROVIDER_SITE_OTHER): Admitting: Family Medicine

## 2022-05-04 DIAGNOSIS — R6882 Decreased libido: Secondary | ICD-10-CM

## 2022-05-04 DIAGNOSIS — R252 Cramp and spasm: Secondary | ICD-10-CM

## 2022-05-04 MED ORDER — ADDYI 100 MG PO TABS: 100.0000 mg | ORAL_TABLET | Freq: Every day | ORAL | 0 refills | Status: DC

## 2022-05-04 MED ORDER — HYDROCHLOROTHIAZIDE 25 MG PO TABS: 25.0000 mg | ORAL_TABLET | Freq: Every day | ORAL | 0 refills | Status: DC

## 2022-05-04 MED ORDER — AMLODIPINE BESYLATE 5 MG PO TABS: ORAL_TABLET | ORAL | 0 refills | Status: DC

## 2022-05-04 NOTE — Progress Notes (Signed)
Subjective:    Patient ID: Brenda Barrett, female    DOB: 1970/10/07, 51 y.o.   MRN: [id number]  HPI Virtual Visit via Video Note  I connected with the patient on 05/04/22 at 10:45 AM EDT by a video enabled telemedicine application and verified that I am speaking with the correct person using two identifiers.  Location patient: home Location provider:work or home office Persons participating in the virtual visit: patient, provider  I discussed the limitations of evaluation and management by telemedicine and the availability of in person appointments. The patient expressed understanding and agreed to proceed.   HPI: Here for some temporary refills and for leg and foot cramps. She is spending a month or so with her daughter in [place], so she needs some refills to be sent there. Also for the past 2 weeks she has a had leg cramps and foot cramps, some of which are quite painful. Just before this began she had what sounds like an enteritis infection. She had nausea with vomiting, diarrhea, and sweats for several days. No fever. These symptoms have all resolved, but the cramps persist. She is drinking Gatorade, eating bananas, and taking one 400 mg magnesium pill a day.    ROS: See pertinent positives and negatives per HPI.  Past Medical History:  Diagnosis Date   Anemia    Depression    GERD (gastroesophageal reflux disease)    Glaucoma    sees Dr. Arvil Chaco    Hyperlipidemia    Hypertension    Migraines    Seasonal allergies    Thyroid disease 2012   multinodular goiter per biopsy, hypothyroidism     Past Surgical History:  Procedure Laterality Date   COLONOSCOPY  07/17/2020   per Dr. Myrtie Neither, internal hemorrhoids, no polyps, repeat in 10 yrs   EYE SURGERY Left 07/2019   Cataract    EYE SURGERY Right 09/2019   Cataract and glaucoma   TUBAL LIGATION      Family History  Problem Relation Age of Onset   Diabetes Other        family hx   Hyperlipidemia  Other        family hx   Stroke Other        family hx   Colon polyps Mother    Colon cancer Neg Hx    Esophageal cancer Neg Hx    Rectal cancer Neg Hx    Stomach cancer Neg Hx      Current Outpatient Medications:    aspirin-acetaminophen-caffeine (EXCEDRIN MIGRAINE) 250-250-65 MG tablet, Take 1 tablet by mouth every 6 (six) hours as needed for headache., Disp: , Rfl:    atorvastatin (LIPITOR) 40 MG tablet, Take 1 tablet (40 mg total) by mouth daily., Disp: 90 tablet, Rfl: 3   Bimatoprost (LUMIGAN OP), Place 1 drop into both eyes daily., Disp: , Rfl:    buPROPion (WELLBUTRIN XL) 150 MG 24 hr tablet, Take 1 tablet (150 mg total) by mouth daily., Disp: 90 tablet, Rfl: 3   buPROPion (WELLBUTRIN XL) 300 MG 24 hr tablet, Take 1 tablet (300 mg total) by mouth daily. APPOINTMENT NEED FOR ADDITIONAL REFILLS, Disp: 90 tablet, Rfl: 3   cholecalciferol (VITAMIN D) 1000 units tablet, Take 1,000 Units by mouth daily., Disp: , Rfl:    COMBIGAN 0.2-0.5 % ophthalmic solution, Place 1 drop into both eyes daily. , Disp: , Rfl:    diclofenac (VOLTAREN) 75 MG EC tablet, Take 1 tablet (75 mg total) by mouth 2 (two)  times daily., Disp: 180 tablet, Rfl: 3   levothyroxine (SYNTHROID) 25 MCG tablet, Take 1 tablet (25 mcg total) by mouth daily before breakfast. APPOINTMENT NEED FOR ADDITIONAL REFILLS, Disp: 90 tablet, Rfl: 3   MAGNESIUM PO, Take by mouth daily., Disp: , Rfl:    methocarbamol (ROBAXIN-750) 750 MG tablet, Take 1 tablet (750 mg total) by mouth every 6 (six) hours as needed for muscle spasms., Disp: 60 tablet, Rfl: 5   potassium chloride (KLOR-CON 10) 10 MEQ tablet, Take 1 tablet (10 mEq total) by mouth daily., Disp: 90 tablet, Rfl: 3   SUMAtriptan (IMITREX) 100 MG tablet, Take 1 tablet (100 mg total) by mouth every 2 (two) hours as needed for migraine. May repeat in 2 hours if headache persists or recurs., Disp: 90 tablet, Rfl: 3   topiramate (TOPAMAX) 25 MG tablet, Take 1 tablet (25 mg total) by mouth  daily., Disp: 90 tablet, Rfl: 3   venlafaxine XR (EFFEXOR-XR) 150 MG 24 hr capsule, Take 1 tablet by mouth daily., Disp: 90 capsule, Rfl: 3   amLODipine (NORVASC) 5 MG tablet, TAKE 1 TABLET (5 MG TOTAL) BY MOUTH DAILY., Disp: 90 tablet, Rfl: 0   Flibanserin (ADDYI) 100 MG TABS, Take 100 mg by mouth at bedtime., Disp: 90 tablet, Rfl: 0   hydrochlorothiazide (HYDRODIURIL) 25 MG tablet, Take 1 tablet (25 mg total) by mouth daily., Disp: 90 tablet, Rfl: 0  EXAM:  VITALS per patient if applicable:  GENERAL: alert, oriented, appears well and in no acute distress  HEENT: atraumatic, conjunttiva clear, no obvious abnormalities on inspection of external nose and ears  NECK: normal movements of the head and neck  LUNGS: on inspection no signs of respiratory distress, breathing rate appears normal, no obvious gross SOB, gasping or wheezing  CV: no obvious cyanosis  MS: moves all visible extremities without noticeable abnormality  PSYCH/NEURO: pleasant and cooperative, no obvious depression or anxiety, speech and thought processing grossly intact  ASSESSMENT AND PLAN: Leg and foot cramps, likely due to electrolyte disturbances. I advised her to increase the magnesium pills to taking 3 pills a day. We refilled a few other medications. Follow up as needed.  Alysia Penna, MD  Discussed the following assessment and plan:  Low libido - Plan: Flibanserin (ADDYI) 100 MG TABS     I discussed the assessment and treatment plan with the patient. The patient was provided an opportunity to ask questions and all were answered. The patient agreed with the plan and demonstrated an understanding of the instructions.   The patient was advised to call back or seek an in-person evaluation if the symptoms worsen or if the condition fails to improve as anticipated.      Review of Systems     Objective:   Physical Exam        Assessment & Plan:

## 2022-06-01 ENCOUNTER — Telehealth: Payer: Self-pay | Admitting: Family Medicine

## 2022-06-01 ENCOUNTER — Other Ambulatory Visit: Payer: Self-pay

## 2022-06-01 DIAGNOSIS — E785 Hyperlipidemia, unspecified: Secondary | ICD-10-CM

## 2022-06-01 MED ORDER — LEVOTHYROXINE SODIUM 25 MCG PO TABS: 25.0000 ug | ORAL_TABLET | Freq: Every day | ORAL | 0 refills | Status: DC

## 2022-06-01 NOTE — Telephone Encounter (Signed)
Pt called to request a refill of the:  levothyroxine (SYNTHROID) 25 MCG tablet   Please [facility] to  [id number] [place], [place] - [address] [facility] [address] AT [facility] [address] Phone: [phone]  Fax: [phone]      Also, Pt states they did not give her the Flibanserin (ADDYI) 100 MG TABS & and the arthritis medication last time she went to the pharmacy on 05/04/22. Please advise.  LVV:  05/04/22

## 2022-06-01 NOTE — Telephone Encounter (Signed)
Spoke with patient informed refill for Levothyroxine 25 mcg [facility] also spoke with pharmacist tech, she stated that refill for Addyi 100mg  tab currently being filled.

## 2022-07-17 ENCOUNTER — Other Ambulatory Visit: Payer: Self-pay | Admitting: Family Medicine

## 2022-07-25 ENCOUNTER — Other Ambulatory Visit: Payer: Self-pay | Admitting: Family Medicine

## 2022-08-22 ENCOUNTER — Other Ambulatory Visit: Payer: Self-pay | Admitting: Family Medicine

## 2022-08-22 DIAGNOSIS — E785 Hyperlipidemia, unspecified: Secondary | ICD-10-CM

## 2022-08-31 ENCOUNTER — Ambulatory Visit (INDEPENDENT_AMBULATORY_CARE_PROVIDER_SITE_OTHER): Admitting: Family Medicine

## 2022-08-31 ENCOUNTER — Encounter: Payer: Self-pay | Admitting: Family Medicine

## 2022-08-31 VITALS — BP 130/80 | HR 56 | Temp 98.1°F | Wt 169.0 lb

## 2022-08-31 DIAGNOSIS — J301 Allergic rhinitis due to pollen: Secondary | ICD-10-CM

## 2022-08-31 DIAGNOSIS — F900 Attention-deficit hyperactivity disorder, predominantly inattentive type: Secondary | ICD-10-CM | POA: Diagnosis not present

## 2022-08-31 DIAGNOSIS — F418 Other specified anxiety disorders: Secondary | ICD-10-CM

## 2022-08-31 DIAGNOSIS — R42 Dizziness and giddiness: Secondary | ICD-10-CM | POA: Diagnosis not present

## 2022-08-31 MED ORDER — AMPHETAMINE-DEXTROAMPHET ER 10 MG PO CP24: 10.0000 mg | ORAL_CAPSULE | Freq: Every day | ORAL | 0 refills | Status: DC

## 2022-08-31 MED ORDER — FLUTICASONE PROPIONATE 50 MCG/ACT NA SUSP: 2.0000 | Freq: Every day | NASAL | 11 refills | Status: DC

## 2022-08-31 NOTE — Progress Notes (Signed)
   Subjective:    Patient ID: Brenda Barrett, female    DOB: 1971/01/06, 52 y.o.   MRN: [id number]  HPI Here for several issues. First her depression and anxiety have been more of a problem the past few months. She took herself off Wellbutrin a few months ago when she learned from her Ophthalmologist that this can contribute to glaucoma. With drops her eye pressures are now down to 7 and 8. She asks if she could have attention deficit issues because all her adult life she has had trouble concentrating on the tasks she has before her. She often feels overwhelmed when she cannot focus, and this makes her depression and anxiety worse. Appetite and sleep are intact. Also she has had some mild vertigo that comes and goes in the past month. No headaches or nausea. Her spring time allergies are acting up causing sinus congestion and sneezing. No cough or ST. She is taking Zyrtec.    Review of Systems  Constitutional: Negative.   HENT:  Positive for congestion and sinus pressure. Negative for ear pain, postnasal drip and sore throat.   Eyes: Negative.   Respiratory: Negative.    Cardiovascular: Negative.   Neurological:  Positive for dizziness. Negative for light-headedness and headaches.  Psychiatric/Behavioral:  Positive for decreased concentration and dysphoric mood. Negative for agitation, behavioral problems, confusion, hallucinations, self-injury, sleep disturbance and suicidal ideas. The patient is nervous/anxious.        Objective:   Physical Exam Constitutional:      General: She is not in acute distress.    Appearance: Normal appearance.  HENT:     Right Ear: Tympanic membrane, ear canal and external ear normal.     Left Ear: Tympanic membrane, ear canal and external ear normal.     Nose: Nose normal.     Mouth/Throat:     Pharynx: Oropharynx is clear.  Eyes:     Conjunctiva/sclera: Conjunctivae normal.  Cardiovascular:     Rate and Rhythm: Normal rate and regular rhythm.      Pulses: Normal pulses.     Heart sounds: Normal heart sounds.  Pulmonary:     Effort: Pulmonary effort is normal.     Breath sounds: Normal breath sounds.  Lymphadenopathy:     Cervical: No cervical adenopathy.  Neurological:     Mental Status: She is alert.  Psychiatric:        Mood and Affect: Mood normal.        Behavior: Behavior normal.        Thought Content: Thought content normal.           Assessment & Plan:  She has ADHD that has not been recognized before. We agreed to treat this with Adderall XR 10 mg every morning. This could be contributing to her depression and anxiety, so we will see if the medication helps these issues. Her springtime allergies are acting up, and I think this is causing her to feel dizzy. She will add Flonase sprays daily to the Zyrtec. Hopefully this will help the vertigo and the allergies. Recheck here in 3-4 weeks.  Alysia Penna, MD

## 2022-09-02 ENCOUNTER — Telehealth: Payer: Self-pay | Admitting: Family Medicine

## 2022-09-02 NOTE — Telephone Encounter (Signed)
Pt call and stated she want to know will dr.Fry call her in something for a sinus infection and want a call back.

## 2022-09-02 NOTE — Telephone Encounter (Signed)
Last OV-08/31/22

## 2022-09-03 ENCOUNTER — Other Ambulatory Visit: Payer: Self-pay

## 2022-09-03 MED ORDER — AZITHROMYCIN 250 MG PO TABS: ORAL_TABLET | ORAL | 0 refills | Status: AC

## 2022-09-03 NOTE — Telephone Encounter (Signed)
Notified pt via My Chart.

## 2022-09-03 NOTE — Telephone Encounter (Signed)
Call in a Zpack  ?

## 2022-09-25 ENCOUNTER — Encounter: Payer: Self-pay | Admitting: Family Medicine

## 2022-09-25 ENCOUNTER — Ambulatory Visit: Admitting: Family Medicine

## 2022-09-25 ENCOUNTER — Ambulatory Visit (INDEPENDENT_AMBULATORY_CARE_PROVIDER_SITE_OTHER): Admitting: Family Medicine

## 2022-09-25 VITALS — BP 130/80 | HR 52 | Temp 97.5°F | Wt 167.0 lb

## 2022-09-25 DIAGNOSIS — F418 Other specified anxiety disorders: Secondary | ICD-10-CM

## 2022-09-25 DIAGNOSIS — F900 Attention-deficit hyperactivity disorder, predominantly inattentive type: Secondary | ICD-10-CM | POA: Diagnosis not present

## 2022-09-25 DIAGNOSIS — R6882 Decreased libido: Secondary | ICD-10-CM

## 2022-09-25 MED ORDER — MAGNESIUM 400 MG PO CAPS: 400.0000 mg | ORAL_CAPSULE | Freq: Every day | ORAL | 3 refills | Status: AC

## 2022-09-25 MED ORDER — VITAMIN D (ERGOCALCIFEROL) 1.25 MG (50000 UNIT) PO CAPS: 50000.0000 [IU] | ORAL_CAPSULE | ORAL | 3 refills | Status: AC

## 2022-09-25 MED ORDER — SUMATRIPTAN SUCCINATE 100 MG PO TABS: 100.0000 mg | ORAL_TABLET | ORAL | 3 refills | Status: AC | PRN

## 2022-09-25 MED ORDER — POTASSIUM CHLORIDE ER 10 MEQ PO TBCR: 10.0000 meq | EXTENDED_RELEASE_TABLET | Freq: Every day | ORAL | 3 refills | Status: DC

## 2022-09-25 MED ORDER — AMPHETAMINE-DEXTROAMPHET ER 20 MG PO CP24: 20.0000 mg | ORAL_CAPSULE | ORAL | 0 refills | Status: DC

## 2022-09-25 MED ORDER — ATORVASTATIN CALCIUM 40 MG PO TABS: 40.0000 mg | ORAL_TABLET | Freq: Every day | ORAL | 3 refills | Status: DC

## 2022-09-25 MED ORDER — ATORVASTATIN CALCIUM 40 MG PO TABS: 40.0000 mg | ORAL_TABLET | Freq: Every day | ORAL | 3 refills | Status: AC

## 2022-09-25 MED ORDER — ADDYI 100 MG PO TABS: 100.0000 mg | ORAL_TABLET | Freq: Every day | ORAL | 3 refills | Status: AC

## 2022-09-25 MED ORDER — POTASSIUM CHLORIDE ER 10 MEQ PO TBCR: 10.0000 meq | EXTENDED_RELEASE_TABLET | Freq: Every day | ORAL | 0 refills | Status: DC

## 2022-09-25 NOTE — Progress Notes (Signed)
   Subjective:    Patient ID: Brenda Barrett, female    DOB: Oct 24, 1970, 52 y.o.   MRN: [id number]  HPI Here to follow up on ADHD, as well as depression with anxiety. She has been taking Adderall XR 10 mg daily, and she immediately saw an improvement in her focus and attention levels. Her depression and anxiety seem to be little better controlled as well. She would like to increase the dose of Adderall.    Review of Systems  Constitutional: Negative.   Respiratory: Negative.    Cardiovascular: Negative.   Psychiatric/Behavioral:  Positive for decreased concentration.        Objective:   Physical Exam Constitutional:      Appearance: Normal appearance.  Cardiovascular:     Rate and Rhythm: Normal rate and regular rhythm.     Pulses: Normal pulses.     Heart sounds: Normal heart sounds.  Pulmonary:     Effort: Pulmonary effort is normal.     Breath sounds: Normal breath sounds.  Neurological:     General: No focal deficit present.     Mental Status: She is alert and oriented to person, place, and time.  Psychiatric:        Mood and Affect: Mood normal.        Behavior: Behavior normal.        Thought Content: Thought content normal.           Assessment & Plan:  ADHD, we will increase the Adderall XR to 20 mg each morning. Report back in a few weeks.  Alysia Penna, MD

## 2022-09-30 ENCOUNTER — Other Ambulatory Visit: Payer: Self-pay | Admitting: *Deleted

## 2022-09-30 MED ORDER — FLUTICASONE PROPIONATE 50 MCG/ACT NA SUSP: 2.0000 | Freq: Every day | NASAL | 11 refills | Status: DC

## 2022-10-07 ENCOUNTER — Telehealth: Payer: Self-pay | Admitting: Family Medicine

## 2022-10-07 NOTE — Telephone Encounter (Signed)
She has taken Venlafaxine, Sertraline, and Bupropion in the past. Does she want to get back on one of these? Or try a new one?

## 2022-10-07 NOTE — Telephone Encounter (Signed)
Pt state that she would prefer getting back on Wellbutrin

## 2022-10-07 NOTE — Telephone Encounter (Signed)
Pt was seen in the office on 09/25/2022 for ADHD, pt states that she needs something for anxiety. Ok to schedule pt

## 2022-10-07 NOTE — Telephone Encounter (Signed)
Pt is calling and she would like new rx for depression [id number] [place], [place] Phone: [phone]  Fax: [phone]

## 2022-10-08 ENCOUNTER — Other Ambulatory Visit: Payer: Self-pay

## 2022-10-08 MED ORDER — BUPROPION HCL ER (XL) 150 MG PO TB24: 150.0000 mg | ORAL_TABLET | Freq: Every day | ORAL | 2 refills | Status: DC

## 2022-10-08 NOTE — Telephone Encounter (Signed)
Pt Rx for Wellbutrin was sent to pt pharmacy, pt was notified

## 2022-10-08 NOTE — Telephone Encounter (Signed)
Patient calling checking on progress of this refill.  

## 2022-10-08 NOTE — Telephone Encounter (Signed)
Agreed. Call in Wellbutrin XL 150 mg daily, #30 with 2 rf

## 2022-10-28 ENCOUNTER — Telehealth: Payer: Self-pay | Admitting: Family Medicine

## 2022-10-28 MED ORDER — AMPHETAMINE-DEXTROAMPHET ER 20 MG PO CP24: 20.0000 mg | ORAL_CAPSULE | ORAL | 0 refills | Status: DC

## 2022-10-28 NOTE — Telephone Encounter (Signed)
Prescription Request  10/28/2022  LOV: 09/25/2022  What is the name of the medication or equipment? amphetamine-dextroamphetamine (ADDERALL XR) 20 MG 24 hr capsule   Have you contacted your pharmacy to request a refill? No   Which pharmacy would you like this sent to?   [id number] [place], [place] - [address] [place] [address] Phone: [phone] Fax: [phone]    Patient notified that their request is being sent to the clinical staff for review and that they should receive a response within 2 business days.   Please advise at Mobile [phone] (mobile)

## 2022-10-28 NOTE — Telephone Encounter (Signed)
Done

## 2022-10-28 NOTE — Addendum Note (Signed)
Addended by: Gershon Crane A on: 10/28/2022 04:34 PM   Modules accepted: Orders

## 2022-10-29 ENCOUNTER — Other Ambulatory Visit: Payer: Self-pay | Admitting: Family Medicine

## 2022-10-30 ENCOUNTER — Other Ambulatory Visit: Payer: Self-pay | Admitting: Family Medicine

## 2022-11-28 ENCOUNTER — Other Ambulatory Visit: Payer: Self-pay | Admitting: Family Medicine

## 2022-11-28 DIAGNOSIS — E785 Hyperlipidemia, unspecified: Secondary | ICD-10-CM

## 2022-12-01 ENCOUNTER — Telehealth: Payer: Self-pay | Admitting: Family Medicine

## 2022-12-01 NOTE — Telephone Encounter (Signed)
Prescription Request  12/01/2022  LOV: 09/25/2022  What is the name of the medication or equipment? amphetamine-dextroamphetamine (ADDERALL XR) 20 MG 24 hr capsule   Have you contacted your pharmacy to request a refill? No   Which pharmacy would you like this sent to?    [id number] [place], [place] - [address] [place] [address] Phone: [phone] Fax: [phone]    Patient notified that their request is being sent to the clinical staff for review and that they should receive a response within 2 business days.   Please advise at Mobile [phone] (mobile)

## 2022-12-02 MED ORDER — AMPHETAMINE-DEXTROAMPHET ER 20 MG PO CP24: 20.0000 mg | ORAL_CAPSULE | ORAL | 0 refills | Status: DC

## 2022-12-02 NOTE — Telephone Encounter (Signed)
Pt LOV was on 09/25/22 Last refill was done on 10/28/22 for 30 days Pt PCP is unavailable Please advise

## 2022-12-02 NOTE — Telephone Encounter (Signed)
Sent in electronically . 30 in PCP absence.

## 2022-12-31 ENCOUNTER — Encounter: Payer: Self-pay | Admitting: Family Medicine

## 2022-12-31 ENCOUNTER — Ambulatory Visit (INDEPENDENT_AMBULATORY_CARE_PROVIDER_SITE_OTHER): Admitting: Family Medicine

## 2022-12-31 ENCOUNTER — Other Ambulatory Visit: Payer: Self-pay

## 2022-12-31 VITALS — BP 120/82 | HR 56 | Temp 97.7°F | Wt 156.2 lb

## 2022-12-31 DIAGNOSIS — D649 Anemia, unspecified: Secondary | ICD-10-CM | POA: Diagnosis not present

## 2022-12-31 DIAGNOSIS — Z0000 Encounter for general adult medical examination without abnormal findings: Secondary | ICD-10-CM

## 2022-12-31 DIAGNOSIS — E042 Nontoxic multinodular goiter: Secondary | ICD-10-CM | POA: Diagnosis not present

## 2022-12-31 DIAGNOSIS — J3089 Other allergic rhinitis: Secondary | ICD-10-CM | POA: Diagnosis not present

## 2022-12-31 LAB — HEPATIC FUNCTION PANEL
ALT: 15 U/L (ref 0–35)
AST: 23 U/L (ref 0–37)
Albumin: 4.2 g/dL (ref 3.5–5.2)
Alkaline Phosphatase: 109 U/L (ref 39–117)
Bilirubin, Direct: 0.1 mg/dL (ref 0.0–0.3)
Total Bilirubin: 0.5 mg/dL (ref 0.2–1.2)
Total Protein: 8 g/dL (ref 6.0–8.3)

## 2022-12-31 LAB — CBC WITH DIFFERENTIAL/PLATELET
Basophils Absolute: 0 10*3/uL (ref 0.0–0.1)
Basophils Relative: 0.9 % (ref 0.0–3.0)
Eosinophils Absolute: 0.2 10*3/uL (ref 0.0–0.7)
Eosinophils Relative: 3.9 % (ref 0.0–5.0)
HCT: 34.3 % — ABNORMAL LOW (ref 36.0–46.0)
Hemoglobin: 10.9 g/dL — ABNORMAL LOW (ref 12.0–15.0)
Lymphocytes Relative: 46.5 % — ABNORMAL HIGH (ref 12.0–46.0)
Lymphs Abs: 2.3 10*3/uL (ref 0.7–4.0)
MCHC: 31.8 g/dL (ref 30.0–36.0)
MCV: 85.9 fl (ref 78.0–100.0)
Monocytes Absolute: 0.3 10*3/uL (ref 0.1–1.0)
Monocytes Relative: 5.6 % (ref 3.0–12.0)
Neutro Abs: 2.2 10*3/uL (ref 1.4–7.7)
Neutrophils Relative %: 43.1 % (ref 43.0–77.0)
Platelets: 283 10*3/uL (ref 150.0–400.0)
RBC: 3.99 Mil/uL (ref 3.87–5.11)
RDW: 12.7 % (ref 11.5–15.5)
WBC: 5 10*3/uL (ref 4.0–10.5)

## 2022-12-31 LAB — BASIC METABOLIC PANEL
BUN: 12 mg/dL (ref 6–23)
CO2: 29 mEq/L (ref 19–32)
Calcium: 10.1 mg/dL (ref 8.4–10.5)
Chloride: 102 mEq/L (ref 96–112)
Creatinine, Ser: 0.9 mg/dL (ref 0.40–1.20)
GFR: 72.64 mL/min (ref >60.00)
Glucose, Bld: 98 mg/dL (ref 70–99)
Potassium: 4.3 mEq/L (ref 3.5–5.1)
Sodium: 137 mEq/L (ref 135–145)

## 2022-12-31 LAB — LIPID PANEL
Cholesterol: 257 mg/dL — ABNORMAL HIGH (ref 0–200)
HDL: 37.1 mg/dL — ABNORMAL LOW (ref >39.00)
NonHDL: 220.12
Total CHOL/HDL Ratio: 7
Triglycerides: 337 mg/dL — ABNORMAL HIGH (ref 0.0–149.0)
VLDL: 67.4 mg/dL — ABNORMAL HIGH (ref 0.0–40.0)

## 2022-12-31 LAB — TSH: TSH: 1.38 u[IU]/mL (ref 0.35–5.50)

## 2022-12-31 LAB — HEMOGLOBIN A1C: Hgb A1c MFr Bld: 6 % (ref 4.6–6.5)

## 2022-12-31 LAB — LDL CHOLESTEROL, DIRECT: Direct LDL: 132 mg/dL

## 2022-12-31 LAB — T4, FREE: Free T4: 1.08 ng/dL (ref 0.60–1.60)

## 2022-12-31 LAB — T3, FREE: T3, Free: 2.6 pg/mL (ref 2.3–4.2)

## 2022-12-31 MED ORDER — BUPROPION HCL ER (XL) 150 MG PO TB24: 150.0000 mg | ORAL_TABLET | Freq: Every day | ORAL | 3 refills | Status: DC

## 2022-12-31 MED ORDER — BUPROPION HCL ER (XL) 300 MG PO TB24
300.0000 mg | ORAL_TABLET | Freq: Every day | ORAL | 3 refills | Status: DC
Start: 2022-12-31 — End: 2024-01-24

## 2022-12-31 MED ORDER — AMPHETAMINE-DEXTROAMPHET ER 25 MG PO CP24
25.0000 mg | ORAL_CAPSULE | ORAL | 0 refills | Status: DC
Start: 2022-12-31 — End: 2023-03-12

## 2022-12-31 NOTE — Progress Notes (Signed)
Subjective:    Patient ID: Brenda Barrett, female    DOB: Sep 18, 1970, 51 y.o.   MRN: [id number]  HPI Here for a well exam. She has a few issues to discuss. First her depression has worsened again, so she wants to increase the Wellbutrin XL back up to a total of 450 mg daily. For the ADHD, she wants to increase the Adderall XR slightly. She also complains of many allergy symptoms including sinus pressure, stuffy nose, and itchy skin. She is using Zyrtec D and Flonase daily.    Review of Systems  Constitutional: Negative.   HENT:  Positive for postnasal drip and sinus pressure.   Eyes: Negative.   Respiratory: Negative.    Cardiovascular: Negative.   Gastrointestinal: Negative.   Genitourinary:  Negative for decreased urine volume, difficulty urinating, dyspareunia, dysuria, enuresis, flank pain, frequency, hematuria, pelvic pain and urgency.  Musculoskeletal: Negative.   Skin: Negative.   Neurological: Negative.  Negative for headaches.  Psychiatric/Behavioral: Negative.         Objective:   Physical Exam Constitutional:      General: She is not in acute distress.    Appearance: Normal appearance. She is well-developed.  HENT:     Head: Normocephalic and atraumatic.     Right Ear: External ear normal.     Left Ear: External ear normal.     Nose: Nose normal.     Mouth/Throat:     Pharynx: No oropharyngeal exudate.  Eyes:     General: No scleral icterus.    Conjunctiva/sclera: Conjunctivae normal.     Pupils: Pupils are equal, round, and reactive to light.  Neck:     Thyroid: No thyromegaly.     Vascular: No JVD.  Cardiovascular:     Rate and Rhythm: Normal rate and regular rhythm.     Pulses: Normal pulses.     Heart sounds: Normal heart sounds. No murmur heard.    No friction rub. No gallop.  Pulmonary:     Effort: Pulmonary effort is normal. No respiratory distress.     Breath sounds: Normal breath sounds. No wheezing or rales.  Chest:     Chest wall:  No tenderness.  Abdominal:     General: Bowel sounds are normal. There is no distension.     Palpations: Abdomen is soft. There is no mass.     Tenderness: There is no abdominal tenderness. There is no guarding or rebound.  Musculoskeletal:        General: No tenderness. Normal range of motion.     Cervical back: Normal range of motion and neck supple.  Lymphadenopathy:     Cervical: No cervical adenopathy.  Skin:    General: Skin is warm and dry.     Findings: No erythema or rash.  Neurological:     General: No focal deficit present.     Mental Status: She is alert and oriented to person, place, and time.     Cranial Nerves: No cranial nerve deficit.     Motor: No abnormal muscle tone.     Coordination: Coordination normal.     Deep Tendon Reflexes: Reflexes are normal and symmetric. Reflexes normal.  Psychiatric:        Mood and Affect: Mood normal.        Behavior: Behavior normal.        Thought Content: Thought content normal.        Judgment: Judgment normal.  Assessment & Plan:  Well exam. We discussed diet and exercise. Get fasting labs. For the depression, we will add Wellbutrin XL 300 mg daily to her 150 mg tablets to make a total of 450 mg daily. For the ADHD we will increase the Adderall XR to 25 mg daily. For her allergies, we will refer her to Allergy for testing.  Gershon Crane, MD

## 2023-01-01 NOTE — Addendum Note (Signed)
Addended by: Gershon Crane A on: 01/01/2023 01:32 PM   Modules accepted: Orders

## 2023-01-13 ENCOUNTER — Other Ambulatory Visit (INDEPENDENT_AMBULATORY_CARE_PROVIDER_SITE_OTHER)

## 2023-01-13 DIAGNOSIS — D649 Anemia, unspecified: Secondary | ICD-10-CM | POA: Diagnosis not present

## 2023-01-13 LAB — IBC + FERRITIN
Ferritin: 56.8 ng/mL (ref 10.0–291.0)
Iron: 78 ug/dL (ref 42–145)
Saturation Ratios: 24.7 % (ref 20.0–50.0)
TIBC: 316.4 ug/dL (ref 250.0–450.0)
Transferrin: 226 mg/dL (ref 212.0–360.0)

## 2023-01-13 LAB — VITAMIN B12: Vitamin B-12: 968 pg/mL — ABNORMAL HIGH (ref 211–911)

## 2023-01-13 LAB — FOLATE: Folate: >23.8 ng/mL (ref >5.9)

## 2023-03-10 ENCOUNTER — Telehealth: Payer: Self-pay | Admitting: Family Medicine

## 2023-03-10 NOTE — Telephone Encounter (Signed)
Prescription Request  03/10/2023  LOV: 12/31/2022  What is the name of the medication or equipment?   amphetamine-dextroamphetamine (ADDERALL XR) 25 MG 24 hr capsule  Pt states she is completely out of this Rx.   Have you contacted your pharmacy to request a refill? Yes   Which pharmacy would you like this sent to?   [id number] [place], [place] - [address] (Ph: [phone])   Patient notified that their request is being sent to the clinical staff for review and that they should receive a response within 2 business days.   Please advise at Mobile [phone] (mobile)

## 2023-03-11 NOTE — Telephone Encounter (Signed)
Pt is aware refill can take up to 3 business day 

## 2023-03-11 NOTE — Telephone Encounter (Signed)
Pt LOV was on 12/31/22 Last refill was done on 12/31/22 Please advise

## 2023-03-12 MED ORDER — AMPHETAMINE-DEXTROAMPHET ER 25 MG PO CP24: 25.0000 mg | ORAL_CAPSULE | ORAL | 0 refills | Status: DC

## 2023-03-12 NOTE — Telephone Encounter (Signed)
Done

## 2023-03-17 NOTE — Telephone Encounter (Addendum)
Pt is calling and need new rxs amphetamine-dextroamphetamine (ADDERALL XR) 25 MG 24 hr capsule send to go to different pharm  [id number] [place], [place] - [address] Phone: [phone]  Fax: [phone]    THE Pt received only #5  at [id number], [place] - [address] (Ph: [phone]) last week

## 2023-03-18 MED ORDER — AMPHETAMINE-DEXTROAMPHET ER 25 MG PO CP24: 25.0000 mg | ORAL_CAPSULE | ORAL | 0 refills | Status: DC

## 2023-03-18 NOTE — Addendum Note (Signed)
Addended by: Gershon Crane A on: 03/18/2023 12:32 PM   Modules accepted: Orders

## 2023-03-18 NOTE — Telephone Encounter (Signed)
I sent them to the new pharmacy  °

## 2023-03-18 NOTE — Telephone Encounter (Signed)
Rx sent in 03/12/23

## 2023-03-18 NOTE — Telephone Encounter (Signed)
Pt.notified

## 2023-03-19 NOTE — Telephone Encounter (Signed)
Requesting rx for amphetamine-dextroamphetamine (ADDERALL XR) 25 MG 24 hr capsule  be sent to [facility], [address] [place], [place], [place] [address], [place]

## 2023-03-22 ENCOUNTER — Other Ambulatory Visit: Payer: Self-pay | Admitting: Family Medicine

## 2023-03-22 NOTE — Telephone Encounter (Signed)
She already has refills until December

## 2023-03-24 NOTE — Telephone Encounter (Signed)
Pt [facility] calling [facility] [place] [facility] [place] [facility] amphetamine-dextroamphetamine (ADDERALL XR) 25 MG 24 hr capsule . Pt [facility]  [facility] [place], [place] - [address] Phone: [phone]  Fax: [phone]

## 2023-03-25 MED ORDER — AMPHETAMINE-DEXTROAMPHET ER 25 MG PO CP24: 25.0000 mg | ORAL_CAPSULE | ORAL | 0 refills | Status: DC

## 2023-03-25 NOTE — Telephone Encounter (Signed)
Pt called requesting to speak to CMA.   Pt was asked if it was regarding a medication refill, as I could help with that. Pt stated it was "kind of for something like that" and would rather speak directly to CMA.   Pt asked that CMA please call back at her earliest convenience.

## 2023-03-25 NOTE — Telephone Encounter (Signed)
I understand because there is a shortage of this medication. Tell her to find a pharmacy that DOES have it and relay this back to [place] so I can send in refills.

## 2023-03-25 NOTE — Telephone Encounter (Signed)
Done

## 2023-03-26 NOTE — Telephone Encounter (Signed)
Pt Rx sent to the correct pharmacy, pt notified

## 2023-04-08 ENCOUNTER — Other Ambulatory Visit: Payer: Self-pay | Admitting: Family Medicine

## 2023-04-08 DIAGNOSIS — E785 Hyperlipidemia, unspecified: Secondary | ICD-10-CM

## 2023-05-05 ENCOUNTER — Other Ambulatory Visit: Payer: Self-pay | Admitting: Family Medicine

## 2023-05-05 NOTE — Telephone Encounter (Signed)
Advised pt that [facility], [facility] and [facility] [place] [facility]

## 2023-05-11 MED ORDER — AMPHETAMINE-DEXTROAMPHET ER 25 MG PO CP24: 25.0000 mg | ORAL_CAPSULE | ORAL | 0 refills | Status: DC

## 2023-05-11 NOTE — Telephone Encounter (Signed)
Done

## 2023-07-06 ENCOUNTER — Other Ambulatory Visit: Payer: Self-pay | Admitting: Family Medicine

## 2023-07-06 DIAGNOSIS — E785 Hyperlipidemia, unspecified: Secondary | ICD-10-CM

## 2023-10-18 ENCOUNTER — Other Ambulatory Visit: Payer: Self-pay | Admitting: Family Medicine

## 2023-10-18 DIAGNOSIS — E785 Hyperlipidemia, unspecified: Secondary | ICD-10-CM

## 2023-10-21 ENCOUNTER — Other Ambulatory Visit: Payer: Self-pay | Admitting: Family Medicine

## 2023-10-21 NOTE — Telephone Encounter (Signed)
 Copied from CRM [id number]. Topic: Clinical - Medication Refill >> Oct 21, 2023  9:16 AM Howard Macho wrote: Most Recent Primary Care Visit:  Provider: [facility]  Department: [facility]  Visit Type: LAB  Date: 01/13/2023  Medication: amphetamine-dextroamphetamine (ADDERALL XR) 25 MG 24 hr capsule (Patient has been out a couple of days)  Has the patient contacted their pharmacy? Yes (Agent: If no, request that the patient contact the pharmacy for the refill. If patient does not wish to contact the pharmacy document the reason why and proceed with request.) (Agent: If yes, when and what did the pharmacy advise?)  Is this the correct pharmacy for this prescription? Yes If no, delete pharmacy and type the correct one.  This is the patient's preferred pharmacy:  [facility] - [place], [place] - [address] [place] [address] Phone: [phone] Fax: [phone]  [phone] [place], [place] - [address] [place] [address] Phone: [phone] Fax: [phone]  Has the prescription been filled recently? No  Is the patient out of the medication? Yes  Has the patient been seen for an appointment in the last year OR does the patient have an upcoming appointment? Yes  Can we respond through MyChart? Yes  Agent: Please be advised that Rx refills may take up to 3 business days. We ask that you follow-up with your pharmacy.

## 2023-10-22 ENCOUNTER — Other Ambulatory Visit: Payer: Self-pay | Admitting: Family Medicine

## 2023-10-26 NOTE — Telephone Encounter (Signed)
Pt message sent to PCP for approval

## 2023-10-26 NOTE — Telephone Encounter (Signed)
 Pt LOV was on 12/31/22 Last refill was done on 03/18/23 Please advise

## 2023-10-27 MED ORDER — AMPHETAMINE-DEXTROAMPHET ER 25 MG PO CP24: 25.0000 mg | ORAL_CAPSULE | ORAL | 0 refills | Status: DC

## 2023-10-27 MED ORDER — AMPHETAMINE-DEXTROAMPHET ER 25 MG PO CP24: 25.0000 mg | ORAL_CAPSULE | ORAL | 0 refills | Status: AC

## 2023-10-27 NOTE — Telephone Encounter (Signed)
 Done

## 2023-10-29 NOTE — Telephone Encounter (Signed)
 FYI This has been done just needs to close pt chart

## 2023-12-09 ENCOUNTER — Other Ambulatory Visit: Payer: Self-pay | Admitting: Family Medicine

## 2023-12-09 DIAGNOSIS — E785 Hyperlipidemia, unspecified: Secondary | ICD-10-CM

## 2023-12-23 ENCOUNTER — Telehealth: Payer: Self-pay

## 2023-12-23 NOTE — Telephone Encounter (Signed)
 Copied from CRM [id number]. Topic: Clinical - Medication Question >> Dec 23, 2023  1:03 PM Aisha D wrote: Reason for CRM: Pt is calling to have the medication refills resent to the pharmacy for the levothyroxine  (SYNTHROID ) 25 MCG tablet, amLODipine  (NORVASC ) 5 MG tablet and the hydrochlorothiazide  (HYDRODIURIL ) 25 MG tablet. Pt stated that the pharmacy stated that they don't have the medication refill in the system.

## 2023-12-24 ENCOUNTER — Other Ambulatory Visit: Payer: Self-pay

## 2023-12-24 MED ORDER — AMLODIPINE BESYLATE 5 MG PO TABS: 5.0000 mg | ORAL_TABLET | Freq: Every day | ORAL | 0 refills | Status: DC

## 2023-12-24 MED ORDER — HYDROCHLOROTHIAZIDE 25 MG PO TABS: 25.0000 mg | ORAL_TABLET | Freq: Every day | ORAL | 0 refills | Status: AC

## 2023-12-24 NOTE — Telephone Encounter (Signed)
 Spoke with pt pharmacy state that thy received the Levothyroxine  Rx but not the rest of prescriptions, refills for amlodipine  and hydrochlorothiazide  sent and pt notified

## 2024-01-22 ENCOUNTER — Other Ambulatory Visit: Payer: Self-pay | Admitting: Family Medicine

## 2024-02-09 ENCOUNTER — Other Ambulatory Visit: Payer: Self-pay | Admitting: Family Medicine

## 2024-02-18 ENCOUNTER — Telehealth: Payer: Self-pay

## 2024-02-18 NOTE — Telephone Encounter (Signed)
 Copied from CRM #[id number]. Topic: Clinical - Medication Question >> Feb 18, 2024  9:46 AM Deaijah H wrote: Reason for CRM: [place] [facility]/ [facility] called in to have 4 scripts filled for patient (buPROPion  (WELLBUTRIN  XL) 150 MG 24 hr tablet/fluticasone  (FLONASE ) 50 MCG/ACT nasal spray/potassium chloride  (KLOR-CON  10) 10 MEQ tablet/Vitamin D , Ergocalciferol , (DRISDOL ) 1.25 MG (50000 UNIT) CAPS capsule). Did advise patient would need an appointment before filling. Per [place] request to send message for approval or not. [facility] callback [phone] just in case Dr. Johnny would like to reach out.   Patient last visit 7/24 & 6/24  & if approved to send -> [facility] [address] [place] [address] Fax: [phone]

## 2024-02-21 ENCOUNTER — Other Ambulatory Visit: Payer: Self-pay

## 2024-02-21 MED ORDER — POTASSIUM CHLORIDE ER 10 MEQ PO TBCR: 10.0000 meq | EXTENDED_RELEASE_TABLET | Freq: Every day | ORAL | 3 refills | Status: AC

## 2024-02-21 MED ORDER — FLUTICASONE PROPIONATE 50 MCG/ACT NA SUSP: 2.0000 | Freq: Every day | NASAL | 11 refills | Status: AC

## 2024-02-21 MED ORDER — BUPROPION HCL ER (XL) 300 MG PO TB24: 300.0000 mg | ORAL_TABLET | Freq: Every day | ORAL | 0 refills | Status: DC

## 2024-02-21 NOTE — Telephone Encounter (Signed)
 Pt has a CPE scheduled for 02/29/24. [facility], pt aware

## 2024-02-29 ENCOUNTER — Encounter: Payer: Self-pay | Admitting: Family Medicine

## 2024-05-11 ENCOUNTER — Other Ambulatory Visit: Payer: Self-pay | Admitting: Family Medicine

## 2024-05-11 DIAGNOSIS — E785 Hyperlipidemia, unspecified: Secondary | ICD-10-CM

## 2024-05-15 ENCOUNTER — Other Ambulatory Visit: Payer: Self-pay | Admitting: Family Medicine

## 2024-05-15 MED ORDER — BUPROPION HCL ER (XL) 300 MG PO TB24: 300.0000 mg | ORAL_TABLET | Freq: Every day | ORAL | 3 refills | Status: AC

## 2024-05-15 MED ORDER — AMPHETAMINE-DEXTROAMPHET ER 25 MG PO CP24: 25.0000 mg | ORAL_CAPSULE | ORAL | 0 refills | Status: AC

## 2024-05-15 NOTE — Telephone Encounter (Signed)
 Done

## 2024-05-15 NOTE — Telephone Encounter (Signed)
 Copied from CRM #[id number]. Topic: Clinical - Medication Refill >> May 15, 2024  9:38 AM Gustabo D wrote: Medication: amphetamine -dextroamphetamine (ADDERALL XR) 25 MG 24 hr  buPROPion  (WELLBUTRIN  XL) 300 MG 24 hr tablet    Has the patient contacted their pharmacy? No (Agent: If no, request that the patient contact the pharmacy for the refill. If patient does not wish to contact the pharmacy document the reason why and proceed with request.) (Agent: If yes, when and what did the pharmacy advise?)  This is the patient's preferred pharmacy:  [phone] [place] - [address] [place] [address] Phone: [phone] Fax: [phone]  Is this the correct pharmacy for this prescription? Yes If no, delete pharmacy and type the correct one.   Has the prescription been filled recently? No  Is the patient out of the medication? Yes  Has the patient been seen for an appointment in the last year OR does the patient have an upcoming appointment? Yes  Can we respond through MyChart? Yes  Agent: Please be advised that Rx refills may take up to 3 business days. We ask that you follow-up with your pharmacy.
